# Patient Record
Sex: Female | Born: 1985 | Race: Black or African American | Hispanic: No | Marital: Married | Smoking: Never smoker
Health system: Southern US, Community
[De-identification: ages and names within clinical notes are randomized; demographics above are authoritative.]

## PROBLEM LIST (undated history)

## (undated) DIAGNOSIS — K2970 Gastritis, unspecified, without bleeding: Secondary | ICD-10-CM

## (undated) DIAGNOSIS — K8020 Calculus of gallbladder without cholecystitis without obstruction: Secondary | ICD-10-CM

## (undated) DIAGNOSIS — F419 Anxiety disorder, unspecified: Secondary | ICD-10-CM

## (undated) DIAGNOSIS — K219 Gastro-esophageal reflux disease without esophagitis: Secondary | ICD-10-CM

## (undated) HISTORY — DX: Anxiety disorder, unspecified: F41.9

## (undated) HISTORY — DX: Calculus of gallbladder without cholecystitis without obstruction: K80.20

---

## 2019-01-27 ENCOUNTER — Telehealth: Payer: Self-pay | Admitting: General Practice

## 2019-01-27 NOTE — Telephone Encounter (Signed)
Patients husband called and is trying to set up new pt appointment for wife, He said she has several concerns and would like for the first visit to be face to face, I let him know that with everything going on we are mainly doing virtual visits but he wanted me to ask to be sure. He didn't tell me what all the concerns were so im not sure if the nurse would like to give a call to verify best way to go about this.

## 2019-01-29 ENCOUNTER — Ambulatory Visit (INDEPENDENT_AMBULATORY_CARE_PROVIDER_SITE_OTHER): Payer: 59 | Admitting: Nurse Practitioner

## 2019-01-29 ENCOUNTER — Encounter: Payer: Self-pay | Admitting: Nurse Practitioner

## 2019-01-29 VITALS — Ht 64.96 in

## 2019-01-29 DIAGNOSIS — F411 Generalized anxiety disorder: Secondary | ICD-10-CM

## 2019-01-29 DIAGNOSIS — R0789 Other chest pain: Secondary | ICD-10-CM

## 2019-01-29 DIAGNOSIS — F322 Major depressive disorder, single episode, severe without psychotic features: Secondary | ICD-10-CM | POA: Diagnosis not present

## 2019-01-29 MED ORDER — IBUPROFEN 200 MG PO TABS: 200.0000 mg | ORAL_TABLET | Freq: Four times a day (QID) | ORAL | 0 refills | Status: DC | PRN

## 2019-01-29 NOTE — Telephone Encounter (Signed)
Heidi Keller is listed as pts PCP so first can we please clarify who pt wants to establish with.  [name], can you please call pt/husband to find about about pts concerns/issues. Depending on what they are, we can try to accommodate in-person visit

## 2019-01-29 NOTE — Telephone Encounter (Signed)
He called again yesterday and wanted to set up appointment with [facility], please disregard first message. Sorry about the confusion. I forgot about the note from day before and he didn't mention it

## 2019-01-29 NOTE — Patient Instructions (Signed)
Living With Anxiety    After being diagnosed with an anxiety disorder, you may be relieved to know why you have felt or behaved a certain way. It is natural to also feel overwhelmed about the treatment ahead and what it will mean for your life. With care and support, you can manage this condition and recover from it.  How to cope with anxiety  Dealing with stress  Stress is your body’s reaction to life changes and events, both good and bad. Stress can last just a few hours or it can be ongoing. Stress can play a major role in anxiety, so it is important to learn both how to cope with stress and how to think about it differently.  Talk with your health care provider or a counselor to learn more about stress reduction. He or she may suggest some stress reduction techniques, such as:  · Music therapy. This can include creating or listening to music that you enjoy and that inspires you.  · Mindfulness-based meditation. This involves being aware of your normal breaths, rather than trying to control your breathing. It can be done while sitting or walking.  · Centering prayer. This is a kind of meditation that involves focusing on a word, phrase, or sacred image that is meaningful to you and that brings you peace.  · Deep breathing. To do this, expand your stomach and inhale slowly through your nose. Hold your breath for 3-5 seconds. Then exhale slowly, allowing your stomach muscles to relax.  · Self-talk. This is a skill where you identify thought patterns that lead to anxiety reactions and correct those thoughts.  · Muscle relaxation. This involves tensing muscles then relaxing them.  Choose a stress reduction technique that fits your lifestyle and personality. Stress reduction techniques take time and practice. Set aside 5-15 minutes a day to do them. Therapists can offer training in these techniques. The training may be covered by some insurance plans. Other things you can do to manage stress include:  · Keeping a  stress diary. This can help you learn what triggers your stress and ways to control your response.  · Thinking about how you respond to certain situations. You may not be able to control everything, but you can control your reaction.  · Making time for activities that help you relax, and not feeling guilty about spending your time in this way.  Therapy combined with coping and stress-reduction skills provides the best chance for successful treatment.  Medicines  Medicines can help ease symptoms. Medicines for anxiety include:  · Anti-anxiety drugs.  · Antidepressants.  · Beta-blockers.  Medicines may be used as the main treatment for anxiety disorder, along with therapy, or if other treatments are not working. Medicines should be prescribed by a health care provider.  Relationships  Relationships can play a big part in helping you recover. Try to spend more time connecting with trusted friends and family members. Consider going to couples counseling, taking family education classes, or going to family therapy. Therapy can help you and others better understand the condition.  How to recognize changes in your condition  Everyone has a different response to treatment for anxiety. Recovery from anxiety happens when symptoms decrease and stop interfering with your daily activities at home or work. This may mean that you will start to:  · Have better concentration and focus.  · Sleep better.  · Be less irritable.  · Have more energy.  · Have improved memory.  It is   important to recognize when your condition is getting worse. Contact your health care provider if your symptoms interfere with home or work and you do not feel like your condition is improving.  Where to find help and support:  You can get help and support from these sources:  · Self-help groups.  · Online and community organizations.  · A trusted spiritual leader.  · Couples counseling.  · Family education classes.  · Family therapy.  Follow these instructions  at home:  · Eat a healthy diet that includes plenty of vegetables, fruits, whole grains, low-fat dairy products, and lean protein. Do not eat a lot of foods that are high in solid fats, added sugars, or salt.  · Exercise. Most adults should do the following:  ? Exercise for at least 150 minutes each week. The exercise should increase your heart rate and make you sweat (moderate-intensity exercise).  ? Strengthening exercises at least twice a week.  · Cut down on caffeine, tobacco, alcohol, and other potentially harmful substances.  · Get the right amount and quality of sleep. Most adults need 7-9 hours of sleep each night.  · Make choices that simplify your life.  · Take over-the-counter and prescription medicines only as told by your health care provider.  · Avoid caffeine, alcohol, and certain over-the-counter cold medicines. These may make you feel worse. Ask your pharmacist which medicines to avoid.  · Keep all follow-up visits as told by your health care provider. This is important.  Questions to ask your health care provider  · Would I benefit from therapy?  · How often should I follow up with a health care provider?  · How long do I need to take medicine?  · Are there any long-term side effects of my medicine?  · Are there any alternatives to taking medicine?  Contact a health care provider if:  · You have a hard time staying focused or finishing daily tasks.  · You spend many hours a day feeling worried about everyday life.  · You become exhausted by worry.  · You start to have headaches, feel tense, or have nausea.  · You urinate more than normal.  · You have diarrhea.  Get help right away if:  · You have a racing heart and shortness of breath.  · You have thoughts of hurting yourself or others.  If you ever feel like you may hurt yourself or others, or have thoughts about taking your own life, get help right away. You can go to your nearest emergency department or call:  · Your local emergency services  (911 in the [place]  · A suicide crisis helpline, such as the National Suicide Prevention Lifeline at 1-800-273-8255. This is open 24-hours a day.  Summary  · Taking steps to deal with stress can help calm you.  · Medicines cannot cure anxiety disorders, but they can help ease symptoms.  · Family, friends, and partners can play a big part in helping you recover from an anxiety disorder.  This information is not intended to replace advice given to you by your health care provider. Make sure you discuss any questions you have with your health care provider.  Document Released: 08/29/2016 Document Revised: 08/29/2016 Document Reviewed: 08/29/2016  Elsevier Interactive Patient Education © 2019 [employer] Inc[employer]

## 2019-01-29 NOTE — Progress Notes (Signed)
Virtual Visit via Video Note  I connected with Heidi Keller on 01/29/19 at  3:30 PM EDT by a video enabled telemedicine application and verified that I am speaking with the correct person using two identifiers.  Location: Patient: Home Provider: Office  Husband and daughter present during video call.  I discussed the limitations of evaluation and management by telemedicine and the availability of in person appointments. The patient expressed understanding and agreed to proceed.   CC: History of Present Illness: Chest Pain   This is a new problem. The current episode started more than 1 month ago. The onset quality is gradual. The problem occurs intermittently. The problem has been waxing and waning. The pain is present in the substernal region. The quality of the pain is described as dull and tightness. The pain does not radiate. Pertinent negatives include no abdominal pain, back pain, claudication, cough, diaphoresis, dizziness, exertional chest pressure, fever, headaches, hemoptysis, irregular heartbeat, leg pain, lower extremity edema, malaise/fatigue, nausea, near-syncope, numbness, orthopnea, palpitations, PND, shortness of breath, sputum production, syncope, vomiting or weakness. Risk factors include lack of exercise, sedentary lifestyle and stress.  Her past medical history is significant for anxiety/panic attacks.  Pertinent negatives for past medical history include no cancer, no diabetes, no DVT, no hypertension and no thyroid problem.  Her family medical history is significant for diabetes, hypertension and sickle cell disease.  Pertinent negatives for family medical history include: no CAD, no connective tissue disease, no heart disease, no early MI, no PE, no PVD, no stroke, no sudden death and no TIA.  Anxiety  Presents for initial visit. Onset was 1 to 5 years ago. The problem has been gradually worsening. Symptoms include chest pain, decreased concentration, depressed mood,  insomnia, malaise, muscle tension, nervous/anxious behavior, panic and restlessness. Patient reports no dizziness, nausea, palpitations or shortness of breath. Symptoms occur constantly. The severity of symptoms is causing significant distress and interfering with daily activities. The symptoms are aggravated by family issues (related to daughte's health: sickle cell diagnosis at age 74).   Risk factors include a major life event. Her past medical history is significant for anxiety/panic attacks. There is no history of anemia, bipolar disorder, depression, hyperthyroidism or suicide attempts. Past treatments include nothing.  married with 6135yrs old daughter, stay at home mother. reports she has not been sexually active in last 2years due to fear of having another child with Sickle cell anemia. Has not informed her family and friends about daughter's diagnosis.  Family History  Problem Relation Age of Onset  . Diabetes Maternal Grandmother   . Hypertension Maternal Grandmother   . Sickle cell anemia Daughter 1   Social History   Socioeconomic History  . Marital status: Married    Spouse name: Not on file  . Number of children: 1  . Years of education: Not on file  . Highest education level: Not on file  Occupational History  . Occupation: Housewife  Social Needs  . Financial resource strain: Not on file  . Food insecurity:    Worry: Not on file    Inability: Not on file  . Transportation needs:    Medical: Patient refused    Non-medical: Patient refused  Tobacco Use  . Smoking status: Never Smoker  . Smokeless tobacco: Never Used  Substance and Sexual Activity  . Alcohol use: Never    Frequency: Never  . Drug use: Never  . Sexual activity: Not Currently  Lifestyle  . Physical activity:    Days  per week: 0 days    Minutes per session: Not on file  . Stress: Very much  Relationships  . Social connections:    Talks on phone: Patient refused    Gets together: Patient refused     Attends religious service: Patient refused    Active member of club or organization: Patient refused    Attends meetings of clubs or organizations: Patient refused    Relationship status: Married  Other Topics Concern  . Not on file  Social History Narrative  . Not on file   No past medical history on file.   Observations/Objective: Unable to provide any vital signs. Physical Exam  Constitutional: She is oriented to person, place, and time. No distress.  Pulmonary/Chest: Effort normal.  Neurological: She is alert and oriented to person, place, and time.  Psychiatric: Her speech is normal and behavior is normal. Thought content normal. Her affect is blunt.   Depression screen PHQ 2/9 01/29/2019  Decreased Interest 0  Down, Depressed, Hopeless 2  PHQ - 2 Score 2  Altered sleeping 2  Tired, decreased energy 1  Change in appetite 3  Feeling bad or failure about yourself  2  Trouble concentrating 2  Moving slowly or fidgety/restless 2  Suicidal thoughts 0  PHQ-9 Score 14  Difficult doing work/chores Extremely dIfficult   GAD 7 : Generalized Anxiety Score 01/29/2019  Nervous, Anxious, on Edge 3  Control/stop worrying 3  Worry too much - different things 3  Trouble relaxing 2  Restless 3  Easily annoyed or irritable 2  Afraid - awful might happen 3  Total GAD 7 Score 19  Anxiety Difficulty Extremely difficult    Assessment and Plan: Heidi Keller for establish care.  Diagnoses and all orders for this visit:  Anterior chest wall pain -     ibuprofen (ADVIL) 200 MG tablet; Take 1-2 tablets (200-400 mg total) by mouth every 6 (six) hours as needed.  GAD (generalized anxiety disorder) -     Ambulatory referral to Psychology  Depression, major, single episode, severe (HCC) -     Ambulatory referral to Psychology   Follow Up Instructions: Use ibuprofen for chest wall pain. F/up in office on Friday: need blood draw, physical exam and to discuss possible use  of SSRI? Entered referral to psychology. Advised to search for local support group related to sickle cell anemia   I discussed the assessment and treatment plan with the patient. The patient was provided an opportunity to ask questions and all were answered. The patient agreed with the plan and demonstrated an understanding of the instructions.   The patient was advised to call back or seek an in-person evaluation if the symptoms worsen or if the condition fails to improve as anticipated.   Alysia Penna, NP

## 2019-01-31 ENCOUNTER — Ambulatory Visit (INDEPENDENT_AMBULATORY_CARE_PROVIDER_SITE_OTHER): Payer: 59 | Admitting: Nurse Practitioner

## 2019-01-31 ENCOUNTER — Encounter: Payer: Self-pay | Admitting: Nurse Practitioner

## 2019-01-31 VITALS — BP 130/90 | HR 81 | Temp 98.6°F | Ht 64.96 in | Wt 155.8 lb

## 2019-01-31 DIAGNOSIS — K219 Gastro-esophageal reflux disease without esophagitis: Secondary | ICD-10-CM | POA: Diagnosis not present

## 2019-01-31 DIAGNOSIS — F411 Generalized anxiety disorder: Secondary | ICD-10-CM

## 2019-01-31 DIAGNOSIS — R0789 Other chest pain: Secondary | ICD-10-CM

## 2019-01-31 DIAGNOSIS — F322 Major depressive disorder, single episode, severe without psychotic features: Secondary | ICD-10-CM | POA: Diagnosis not present

## 2019-01-31 MED ORDER — OMEPRAZOLE 20 MG PO CPDR: 20.0000 mg | DELAYED_RELEASE_CAPSULE | Freq: Every day | ORAL | 0 refills | Status: DC

## 2019-01-31 NOTE — Patient Instructions (Addendum)
Go to lab for blood draw. You will be contacted to schedule appt with psychiatry and psychology. Continue use of tylenol or ibuprofen as needed for pain F/up in 20month.   Living With Anxiety  After being diagnosed with an anxiety disorder, you may be relieved to know why you have felt or behaved a certain way. It is natural to also feel overwhelmed about the treatment ahead and what it will mean for your life. With care and support, you can manage this condition and recover from it. How to cope with anxiety Dealing with stress Stress is your body's reaction to life changes and events, both good and bad. Stress can last just a few hours or it can be ongoing. Stress can play a major role in anxiety, so it is important to learn both how to cope with stress and how to think about it differently. Talk with your health care provider or a counselor to learn more about stress reduction. He or she may suggest some stress reduction techniques, such as:  Music therapy. This can include creating or listening to music that you enjoy and that inspires you.  Mindfulness-based meditation. This involves being aware of your normal breaths, rather than trying to control your breathing. It can be done while sitting or walking.  Centering prayer. This is a kind of meditation that involves focusing on a word, phrase, or sacred image that is meaningful to you and that brings you peace.  Deep breathing. To do this, expand your stomach and inhale slowly through your nose. Hold your breath for 3-5 seconds. Then exhale slowly, allowing your stomach muscles to relax.  Self-talk. This is a skill where you identify thought patterns that lead to anxiety reactions and correct those thoughts.  Muscle relaxation. This involves tensing muscles then relaxing them. Choose a stress reduction technique that fits your lifestyle and personality. Stress reduction techniques take time and practice. Set aside 5-15 minutes a day to do  them. Therapists can offer training in these techniques. The training may be covered by some insurance plans. Other things you can do to manage stress include:  Keeping a stress diary. This can help you learn what triggers your stress and ways to control your response.  Thinking about how you respond to certain situations. You may not be able to control everything, but you can control your reaction.  Making time for activities that help you relax, and not feeling guilty about spending your time in this way. Therapy combined with coping and stress-reduction skills provides the best chance for successful treatment. Medicines Medicines can help ease symptoms. Medicines for anxiety include:  Anti-anxiety drugs.  Antidepressants.  Beta-blockers. Medicines may be used as the main treatment for anxiety disorder, along with therapy, or if other treatments are not working. Medicines should be prescribed by a health care provider. Relationships Relationships can play a big part in helping you recover. Try to spend more time connecting with trusted friends and family members. Consider going to couples counseling, taking family education classes, or going to family therapy. Therapy can help you and others better understand the condition. How to recognize changes in your condition Everyone has a different response to treatment for anxiety. Recovery from anxiety happens when symptoms decrease and stop interfering with your daily activities at home or work. This may mean that you will start to:  Have better concentration and focus.  Sleep better.  Be less irritable.  Have more energy.  Have improved memory. It is important  to recognize when your condition is getting worse. Contact your health care provider if your symptoms interfere with home or work and you do not feel like your condition is improving. Where to find help and support: You can get help and support from these sources:  Self-help  groups.  Online and [employer] organizations.  A trusted spiritual leader.  Couples counseling.  Family education classes.  Family therapy. Follow these instructions at home:  Eat a healthy diet that includes plenty of vegetables, fruits, whole grains, low-fat dairy products, and lean protein. Do not eat a lot of foods that are high in solid fats, added sugars, or salt.  Exercise. Most adults should do the following: ? Exercise for at least 150 minutes each week. The exercise should increase your heart rate and make you sweat (moderate-intensity exercise). ? Strengthening exercises at least twice a week.  Cut down on caffeine, tobacco, alcohol, and other potentially harmful substances.  Get the right amount and quality of sleep. Most adults need 7-9 hours of sleep each night.  Make choices that simplify your life.  Take over-the-counter and prescription medicines only as told by your health care provider.  Avoid caffeine, alcohol, and certain over-the-counter cold medicines. These may make you feel worse. Ask your pharmacist which medicines to avoid.  Keep all follow-up visits as told by your health care provider. This is important. Questions to ask your health care provider  Would I benefit from therapy?  How often should I follow up with a health care provider?  How long do I need to take medicine?  Are there any long-term side effects of my medicine?  Are there any alternatives to taking medicine? Contact a health care provider if:  You have a hard time staying focused or finishing daily tasks.  You spend many hours a day feeling worried about everyday life.  You become exhausted by worry.  You start to have headaches, feel tense, or have nausea.  You urinate more than normal.  You have diarrhea. Get help right away if:  You have a racing heart and shortness of breath.  You have thoughts of hurting yourself or others. If you ever feel like you may hurt  yourself or others, or have thoughts about taking your own life, get help right away. You can go to your nearest emergency department or call:  Your local emergency services (911 in the [place]  A suicide crisis helpline, such as the National Suicide Prevention Lifeline at 313-768-14151-351-007-8144. This is open 24-hours a day. Summary  Taking steps to deal with stress can help calm you.  Medicines cannot cure anxiety disorders, but they can help ease symptoms.  Family, friends, and partners can play a big part in helping you recover from an anxiety disorder. This information is not intended to replace advice given to you by your health care provider. Make sure you discuss any questions you have with your health care provider. Document Released: 08/29/2016 Document Revised: 08/29/2016 Document Reviewed: 08/29/2016 Elsevier Interactive Patient Education  2019 [employer]

## 2019-01-31 NOTE — Progress Notes (Signed)
Subjective:  Patient ID: Heidi Keller, female    DOB: 11/18/1985  Age: 33 y.o. 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 still has pain around chest area and on the side,lightheadache/ pt mention she feels "swelling under arm and right side neck. fasting)  HPI Chest wall pain: Resolved with use of ibuprofen. Chest wall pain not related to food intake Reports intermittent heartburn with certain food.  Anxiety and depression: Related to daughter's health and sickle cell diagnosis. Denies any physical or emotional or verbal abuse. She is not interested in use of medication at this time. She wants to try counseling at this time.  Reviewed past Medical, Social and Family history today.  Outpatient Medications Prior to Visit  Medication Sig Dispense Refill  . ibuprofen (ADVIL) 200 MG tablet Take 1-2 tablets (200-400 mg total) by mouth every 6 (six) hours as needed. 30 tablet 0  . Multiple Vitamin (MULTIVITAMIN) tablet Take 1 tablet by mouth daily.     No facility-administered medications prior to visit.     ROS Review of Systems  Constitutional: Positive for malaise/fatigue. Negative for chills, fever and weight loss.  Respiratory: Negative.   Cardiovascular: Negative.   Gastrointestinal: Positive for heartburn. Negative for constipation and diarrhea.  Genitourinary: Negative.   Musculoskeletal: Positive for joint pain, myalgias and neck pain.  Skin: Negative.   Psychiatric/Behavioral: Negative.     Objective:  BP 130/90   Pulse 81   Temp 98.6 F (37 C) (Oral)   Ht 5' 4.96" (1.65 m)   Wt 155 lb 12.8 oz (70.7 kg)   LMP 01/26/2019 (Exact Date)   SpO2 96%   BMI 25.96 kg/m   BP Readings from Last 3 Encounters:  01/31/19 130/90    Wt Readings from Last 3 Encounters:  01/31/19 155 lb 12.8 oz (70.7 kg)   Physical Exam Vitals signs reviewed.  Constitutional:      General: She is not in acute distress. HENT:   Right Ear: External ear normal.     Left Ear: External ear normal.     Nose: Nose normal.     Mouth/Throat:     Mouth: Mucous membranes are moist.     Pharynx: No posterior oropharyngeal erythema.  Eyes:     Extraocular Movements: Extraocular movements intact.     Conjunctiva/sclera: Conjunctivae normal.  Neck:     Musculoskeletal: Normal range of motion and neck supple.  Cardiovascular:     Rate and Rhythm: Normal rate and regular rhythm.     Pulses: Normal pulses.     Heart sounds: Normal heart sounds.  Pulmonary:     Effort: Pulmonary effort is normal.     Breath sounds: Normal breath sounds.  Chest:     Chest wall: No mass or swelling.  Abdominal:     General: Bowel sounds are normal.     Palpations: Abdomen is soft.  Musculoskeletal: Normal range of motion.  Lymphadenopathy:     Cervical: No cervical adenopathy.     Upper Body:     Right upper body: No supraclavicular, axillary or pectoral adenopathy.     Left upper body: No supraclavicular, axillary or pectoral adenopathy.  Skin:    Findings: No erythema or rash.  Neurological:     Mental Status: She is alert and oriented to person, place, and time.  Psychiatric:        Attention and Perception: Attention normal.        Mood  and Affect: Mood is depressed. Affect is flat.        Behavior: Behavior is slowed. Behavior is cooperative.        Thought Content: Thought content does not include homicidal or suicidal ideation. Thought content does not include homicidal or suicidal plan.        Cognition and Memory: Cognition normal.     Comments: Does not maintain eye contact    Lab Results  Component Value Date   WBC 2.7 (L) 01/31/2019   HGB 12.5 01/31/2019   HCT 38.1 01/31/2019   PLT 197 01/31/2019   GLUCOSE 88 01/31/2019   ALT 9 01/31/2019   AST 11 01/31/2019   NA 139 01/31/2019   K 4.3 01/31/2019   CL 107 01/31/2019   CREATININE 0.74 01/31/2019   BUN 9 01/31/2019   CO2 26 01/31/2019   TSH 2.55 01/31/2019     Assessment & Plan:   Andrian was seen today for follow-up.  Diagnoses and all orders for this visit:  Right-sided chest wall pain -     Vitamin D 1,25 dihydroxy  GAD (generalized anxiety disorder) -     Comprehensive metabolic panel -     CBC with Differential/Platelet -     TSH -     Vitamin D 1,25 dihydroxy  Gastroesophageal reflux disease without esophagitis -     omeprazole (PRILOSEC) 20 MG capsule; Take 1 capsule (20 mg total) by mouth daily.  Depression, major, single episode, severe (HCC) -     Vitamin D 1,25 dihydroxy   I am having Jenifer Varricchio start on omeprazole. I am also having her maintain her multivitamin and ibuprofen.  Meds ordered this encounter  Medications  . omeprazole (PRILOSEC) 20 MG capsule    Sig: Take 1 capsule (20 mg total) by mouth daily.    Dispense:  30 capsule    Refill:  0    Order Specific Question:   Supervising Provider    Answer:   MATTHEWS, CODY [[id number]]    Problem List Items Addressed This Visit      Digestive   Gastroesophageal reflux disease without esophagitis    Start omeprazole      Relevant Medications   omeprazole (PRILOSEC) 20 MG capsule     Other   Depression, major, single episode, severe (HCC)    Declined use of medication at this time. Entered referral for counseling F/up in 3month      Relevant Orders   Vitamin D 1,25 dihydroxy   GAD (generalized anxiety disorder)    Declined use of medication at this time. Entered referral for counseling F/up in 54month      Relevant Orders   Comprehensive metabolic panel (Completed)   CBC with Differential/Platelet (Completed)   TSH (Completed)   Vitamin D 1,25 dihydroxy    Other Visit Diagnoses    Right-sided chest wall pain    -  Primary   Relevant Orders   Vitamin D 1,25 dihydroxy      Follow-up: Return in about 4 weeks (around 02/28/2019) for video call (anxiety and depression).  Heidi Penna, NP

## 2019-02-03 ENCOUNTER — Encounter: Payer: Self-pay | Admitting: Nurse Practitioner

## 2019-02-03 DIAGNOSIS — F322 Major depressive disorder, single episode, severe without psychotic features: Secondary | ICD-10-CM | POA: Insufficient documentation

## 2019-02-03 DIAGNOSIS — K219 Gastro-esophageal reflux disease without esophagitis: Secondary | ICD-10-CM | POA: Insufficient documentation

## 2019-02-03 DIAGNOSIS — F411 Generalized anxiety disorder: Secondary | ICD-10-CM | POA: Insufficient documentation

## 2019-02-03 NOTE — Assessment & Plan Note (Signed)
Declined use of medication at this time. Entered referral for counseling F/up in 68month

## 2019-02-03 NOTE — Assessment & Plan Note (Signed)
Declined use of medication at this time. Entered referral for counseling F/up in 1month 

## 2019-02-03 NOTE — Assessment & Plan Note (Signed)
Start omeprazole

## 2019-02-04 LAB — COMPREHENSIVE METABOLIC PANEL
AG Ratio: 1.6 (calc) (ref 1.0–2.5)
ALT: 9 U/L (ref 6–29)
AST: 11 U/L (ref 10–30)
Albumin: 4.6 g/dL (ref 3.6–5.1)
Alkaline phosphatase (APISO): 36 U/L (ref 31–125)
BUN: 9 mg/dL (ref 7–25)
CO2: 26 mmol/L (ref 20–32)
Calcium: 9.7 mg/dL (ref 8.6–10.2)
Chloride: 107 mmol/L (ref 98–110)
Creat: 0.74 mg/dL (ref 0.50–1.10)
Globulin: 2.8 g/dL (calc) (ref 1.9–3.7)
Glucose, Bld: 88 mg/dL (ref 65–99)
Potassium: 4.3 mmol/L (ref 3.5–5.3)
Sodium: 139 mmol/L (ref 135–146)
Total Bilirubin: 1.5 mg/dL — ABNORMAL HIGH (ref 0.2–1.2)
Total Protein: 7.4 g/dL (ref 6.1–8.1)

## 2019-02-04 LAB — CBC WITH DIFFERENTIAL/PLATELET
Absolute Monocytes: 162 cells/uL — ABNORMAL LOW (ref 200–950)
Basophils Absolute: 22 cells/uL (ref 0–200)
Basophils Relative: 0.8 %
Eosinophils Absolute: 11 cells/uL — ABNORMAL LOW (ref 15–500)
Eosinophils Relative: 0.4 %
HCT: 38.1 % (ref 35.0–45.0)
Hemoglobin: 12.5 g/dL (ref 11.7–15.5)
Lymphs Abs: 1247 cells/uL (ref 850–3900)
MCH: 30.7 pg (ref 27.0–33.0)
MCHC: 32.8 g/dL (ref 32.0–36.0)
MCV: 93.6 fL (ref 80.0–100.0)
MPV: 12.4 fL (ref 7.5–12.5)
Monocytes Relative: 6 %
Neutro Abs: 1258 cells/uL — ABNORMAL LOW (ref 1500–7800)
Neutrophils Relative %: 46.6 %
Platelets: 197 10*3/uL (ref 140–400)
RBC: 4.07 10*6/uL (ref 3.80–5.10)
RDW: 11.9 % (ref 11.0–15.0)
Total Lymphocyte: 46.2 %
WBC: 2.7 10*3/uL — ABNORMAL LOW (ref 3.8–10.8)

## 2019-02-04 LAB — VITAMIN D 1,25 DIHYDROXY
Vitamin D 1, 25 (OH)2 Total: 77 pg/mL — ABNORMAL HIGH (ref 18–72)
Vitamin D2 1, 25 (OH)2: <8 pg/mL
Vitamin D3 1, 25 (OH)2: 77 pg/mL

## 2019-02-04 LAB — TSH: TSH: 2.55 mIU/L

## 2019-03-03 ENCOUNTER — Encounter: Payer: Self-pay | Admitting: Nurse Practitioner

## 2019-03-03 ENCOUNTER — Other Ambulatory Visit: Payer: Self-pay

## 2019-03-03 ENCOUNTER — Ambulatory Visit (INDEPENDENT_AMBULATORY_CARE_PROVIDER_SITE_OTHER): Payer: 59 | Admitting: Nurse Practitioner

## 2019-03-03 ENCOUNTER — Ambulatory Visit (INDEPENDENT_AMBULATORY_CARE_PROVIDER_SITE_OTHER): Payer: 59

## 2019-03-03 VITALS — Ht 64.96 in

## 2019-03-03 DIAGNOSIS — R0789 Other chest pain: Secondary | ICD-10-CM

## 2019-03-03 DIAGNOSIS — F322 Major depressive disorder, single episode, severe without psychotic features: Secondary | ICD-10-CM | POA: Diagnosis not present

## 2019-03-03 DIAGNOSIS — D72829 Elevated white blood cell count, unspecified: Secondary | ICD-10-CM | POA: Diagnosis not present

## 2019-03-03 LAB — CBC WITH DIFFERENTIAL/PLATELET
Absolute Monocytes: 308 cells/uL (ref 200–950)
Basophils Absolute: 31 cells/uL (ref 0–200)
Basophils Relative: 0.7 %
Eosinophils Absolute: 31 cells/uL (ref 15–500)
Eosinophils Relative: 0.7 %
HCT: 38.3 % (ref 35.0–45.0)
Hemoglobin: 12.7 g/dL (ref 11.7–15.5)
Lymphs Abs: 1905 cells/uL (ref 850–3900)
MCH: 30.8 pg (ref 27.0–33.0)
MCHC: 33.2 g/dL (ref 32.0–36.0)
MCV: 92.7 fL (ref 80.0–100.0)
MPV: 12.3 fL (ref 7.5–12.5)
Monocytes Relative: 7 %
Neutro Abs: 2125 cells/uL (ref 1500–7800)
Neutrophils Relative %: 48.3 %
Platelets: 206 10*3/uL (ref 140–400)
RBC: 4.13 10*6/uL (ref 3.80–5.10)
RDW: 12.1 % (ref 11.0–15.0)
Total Lymphocyte: 43.3 %
WBC: 4.4 10*3/uL (ref 3.8–10.8)

## 2019-03-03 LAB — POCT URINE PREGNANCY: Preg Test, Ur: NEGATIVE

## 2019-03-03 NOTE — Patient Instructions (Signed)
Go to lab for repeat cbc, urine pregnancy and CXR. F/up prn

## 2019-03-03 NOTE — Progress Notes (Signed)
Virtual Visit via Video Note  I connected with Heidi Keller on 03/03/19 at  1:00 PM EDT by a video enabled telemedicine application and verified that I am speaking with the correct person using two identifiers.  Location: Patient: Home Provider: Office   I discussed the limitations of evaluation and management by telemedicine and the availability of in person appointments. The patient expressed understanding and agreed to proceed.  CC: Depression f/up, chest wall pain.  History of Present Illness: Heidi Keller states she no longer needs  appt with psychologist. She has reached out to local sickle cell support group and obtained some useful information. She reports concern about intermittent right chest wall pain, waxing and waning, resolves with use of ibuprofen, denies any new symptoms. Rates discomfort 3-10. She is requesting for CXR.   Review of Systems  Constitutional: Negative.   Respiratory: Negative.   Cardiovascular: Negative.   Gastrointestinal: Negative.   Genitourinary: Negative.   Skin: Negative.   Neurological: Negative for dizziness, tingling and sensory change.  Psychiatric/Behavioral: Negative for substance abuse and suicidal ideas. The patient is nervous/anxious. The patient does not have insomnia.    Observations/Objective: Physical Exam  Constitutional: She is oriented to person, place, and time. No distress.  Neurological: She is alert and oriented to person, place, and time.  Psychiatric: She has a normal mood and affect. Her behavior is normal. Thought content normal.   Assessment and Plan: Heidi Keller was seen today for follow-up.  Diagnoses and all orders for this visit:  Depression, major, single episode, severe (HCC)  Leukocytosis, unspecified type -     CBC w/Diff  Right-sided chest wall pain -     DG Chest 2 View; Future -     POCT urine pregnancy   Follow Up Instructions: Go to lab for repeat cbc, urine pregnancy and CXR. F/up prn   I  discussed the assessment and treatment plan with the patient. The patient was provided an opportunity to ask questions and all were answered. The patient agreed with the plan and demonstrated an understanding of the instructions.   The patient was advised to call back or seek an in-person evaluation if the symptoms worsen or if the condition fails to improve as anticipated.   Wilfred Lacy, NP

## 2019-04-15 ENCOUNTER — Encounter: Payer: Self-pay | Admitting: Nurse Practitioner

## 2019-04-15 DIAGNOSIS — R1011 Right upper quadrant pain: Secondary | ICD-10-CM

## 2019-04-15 DIAGNOSIS — K8020 Calculus of gallbladder without cholecystitis without obstruction: Secondary | ICD-10-CM

## 2019-04-17 ENCOUNTER — Encounter: Payer: Self-pay | Admitting: Nurse Practitioner

## 2019-04-17 ENCOUNTER — Telehealth (INDEPENDENT_AMBULATORY_CARE_PROVIDER_SITE_OTHER): Payer: 59 | Admitting: Nurse Practitioner

## 2019-04-17 ENCOUNTER — Telehealth: Payer: 59 | Admitting: Family

## 2019-04-17 ENCOUNTER — Other Ambulatory Visit: Payer: Self-pay

## 2019-04-17 DIAGNOSIS — R1013 Epigastric pain: Secondary | ICD-10-CM | POA: Diagnosis not present

## 2019-04-17 DIAGNOSIS — I889 Nonspecific lymphadenitis, unspecified: Secondary | ICD-10-CM

## 2019-04-17 DIAGNOSIS — K0889 Other specified disorders of teeth and supporting structures: Secondary | ICD-10-CM

## 2019-04-17 MED ORDER — AMOXICILLIN-POT CLAVULANATE 875-125 MG PO TABS: 1.0000 | ORAL_TABLET | Freq: Two times a day (BID) | ORAL | 0 refills | Status: AC

## 2019-04-17 NOTE — Patient Instructions (Addendum)
You will be contacted to schedule appt for ABD [place].  Schedule appt with dentist within 2weeks to oral abx completion.    Dental Pain Dental pain may be caused by many things, including:  Tooth decay (cavities or caries).  Infection.  The inner part of the tooth being filled with pus (abscess).  Injury. Sometimes the cause of pain is unknown. Your pain can vary. It may be mild or severe. You may have it all the time, or it may occur only when you are:  Chewing.  Exposed to hot or cold temperature.  Eating or drinking sugary foods or beverages, such as soda or candy. Follow these instructions at home: Medicines  Take over-the-counter and prescription medicines only as told by your doctor.  If you were prescribed an antibiotic medicine, take it as told by your doctor. Do not stop taking the medicine even if you start to feel better. Eating and drinking  Do not eat foods or drinks that cause you pain. These include: ? Very hot or very cold foods or drinks. ? Sweet or sugary foods or drinks. Managing pain and swelling   Gargle with a salt-water mixture 3-4 times a day. To make this, dissolve -1 tsp of salt in 1 cup of warm water.  If told, put ice on the painful area of your face: ? Put ice in a plastic bag. ? Place a towel between your skin and the bag. ? Leave the ice on for 20 minutes, 2-3 times a day. Brushing your teeth  Brush your teeth twice a day using a fluoride toothpaste.  Floss your teeth once a day.  Use a toothpaste made for sensitive teeth as told by your doctor.  Use a soft toothbrush. General instructions  Do not apply heat to the outside of your face.  Watch your dental pain. Let your doctor know if there are any changes.  Keep all follow-up visits as told by your doctor. This is important. Contact a doctor if:  Your pain is not relieved by medicines.  You have new symptoms.  Your symptoms get worse. Get help right away if:  You cannot  open your mouth.  You are having trouble breathing or swallowing.  You have a fever.  Your face, neck, or jaw is swollen. Summary  Dental pain may be caused by many things, including tooth decay, injury, or infection. In some cases, the cause is not known.  Your pain may be mild or severe. You may have pain all the time, or you may have it only when you eat or drink.  Take over-the-counter and prescription medicines only as told by your doctor.  Watch your dental pain for any changes. Let your doctor know if symptoms get worse. This information is not intended to replace advice given to you by your health care provider. Make sure you discuss any questions you have with your health care provider. Document Released: 02/21/2008 Document Revised: 12/31/2018 Document Reviewed: 09/17/2017 Elsevier Patient Education  2020 [employer].

## 2019-04-17 NOTE — Progress Notes (Signed)
Based on what you shared with me, I feel your condition warrants further evaluation and I recommend that you be seen for a face to face office visit.  NOTE: If you entered your credit card information for this eVisit, you will not be charged. You may see a "hold" on your card for the $35 but that hold will drop off and you will not have a charge processed.  If you are having a true medical emergency please call 911.     For an urgent face to face visit, [facility] has five urgent care centers for your convenience:    [email or web address] to reserve your spot online an avoid wait times  [facility] (New Address!) [address], [address] [place], [place] [address] *Just off [address], across the road from [employer]* Modified hours of operation: Monday-Friday, 12 PM to 6 PM  Closed Saturday & Sunday   The following sites will take your insurance:  . [facility]    [phone]                  Get Driving Directions  [address] , [place] [address] . 10 am to 8 pm Monday-Friday . 12 pm to 8 pm Saturday-Sunday   . [facility]  [phone]                  Get Driving Directions  [address], [address] [place], [place] [address] . 8 am to 8 pm Monday-Friday . 9 am to 6 pm Saturday . 11 am to 6 pm Sunday   . [facility] [place]  [phone]                  Get Driving Directions   [address] [place], [place] [address] . 8 am to 8 pm Monday-Friday . 8 am to 4 pm Saturday-Sunday    .  [facility]                    Get Driving Directions  [phone]  [address] [place], [place] [address]  . Monday-Friday, 12 PM to 6 PM    Your e-visit answers were reviewed by a board certified advanced clinical practitioner to complete your personal care plan.  Thank you for using e-Visits. 

## 2019-04-17 NOTE — Progress Notes (Signed)
Virtual Visit via Video Note  I connected with Mike Silbaugh on 04/17/19 at  4:00 PM EDT by a video enabled telemedicine application and verified that I am speaking with the correct person using two identifiers.  Location: Patient: Home Provider: Office   I discussed the limitations of evaluation and management by telemedicine and the availability of in person appointments. The patient expressed understanding and agreed to proceed.  CC: intermittent right side pain, tooth pain and swollen lymph node.  History of Present Illness: Dental Pain  This is a new problem. The current episode started 1 to 4 weeks ago. The problem occurs constantly. The problem has been unchanged. Associated symptoms include sinus pressure and thermal sensitivity. Pertinent negatives include no difficulty swallowing, facial pain, fever or oral bleeding. She has tried acetaminophen and NSAIDs for the symptoms. The treatment provided mild relief.   She also reports persistent intermittent right chest wall and epigastric pain despite use of omeprazole, tylenol, and ibuprofen. Denies any SOB or fever or PND or rash or injury.   Observations/Objective: Unable to obtain any vital signs Physical Exam  Constitutional: No distress.  HENT:  Head: Normocephalic.  Right Ear: External ear normal.  Left Ear: External ear normal.  Nose: Nose normal.  Mouth/Throat: Oropharynx is clear and moist. No oral lesions. No trismus in the jaw. No tonsillar abscesses.    Neck: Normal range of motion. Neck supple. No edema and no erythema present. No thyroid mass present.    Pulmonary/Chest: Effort normal.   Assessment and Plan: Gracelin was seen today for lump on chin.  Diagnoses and all orders for this visit:  Epigastric pain -     US Abdomen Limited RUQ; Future  Tooth pain -     amoxicillin-clavulanate (AUGMENTIN) 875-125 MG tablet; Take 1 tablet by mouth 2 (two) times daily for 7 days. With food   Follow Up  Instructions: See discharge summary.   I discussed the assessment and treatment plan with the patient. The patient was provided an opportunity to ask questions and all were answered. The patient agreed with the plan and demonstrated an understanding of the instructions.   The patient was advised to call back or seek an in-person evaluation if the symptoms worsen or if the condition fails to improve as anticipated.   Wilfred Lacy, NP

## 2019-04-21 ENCOUNTER — Ambulatory Visit
Admission: RE | Admit: 2019-04-21 | Discharge: 2019-04-21 | Disposition: A | Discharge status: Home / Self Care | Payer: 59 | Source: Ambulatory Visit | Attending: Nurse Practitioner | Admitting: Nurse Practitioner

## 2019-04-21 DIAGNOSIS — R1013 Epigastric pain: Secondary | ICD-10-CM

## 2019-04-21 NOTE — Telephone Encounter (Signed)
-----   Message from Janan Ridge, LPN sent at 02/20/3328 10:58 AM EDT ----- Pt verbalized understanding of [place] report. Pt agrees to be referred to general surgeon

## 2019-04-24 ENCOUNTER — Telehealth: Payer: Self-pay | Admitting: Nurse Practitioner

## 2019-04-24 ENCOUNTER — Encounter: Payer: 59 | Admitting: Nurse Practitioner

## 2019-04-24 NOTE — Telephone Encounter (Signed)

## 2019-04-25 ENCOUNTER — Ambulatory Visit (INDEPENDENT_AMBULATORY_CARE_PROVIDER_SITE_OTHER): Payer: 59 | Admitting: Nurse Practitioner

## 2019-04-25 ENCOUNTER — Encounter: Payer: Self-pay | Admitting: Nurse Practitioner

## 2019-04-25 ENCOUNTER — Other Ambulatory Visit (HOSPITAL_COMMUNITY)
Admission: RE | Admit: 2019-04-25 | Discharge: 2019-04-25 | Disposition: A | Discharge status: Home / Self Care | Payer: 59 | Source: Ambulatory Visit | Attending: Nurse Practitioner | Admitting: Nurse Practitioner

## 2019-04-25 ENCOUNTER — Other Ambulatory Visit: Payer: Self-pay

## 2019-04-25 VITALS — BP 124/82 | HR 79 | Temp 98.2°F | Ht 64.96 in | Wt 160.2 lb

## 2019-04-25 DIAGNOSIS — Z13220 Encounter for screening for lipoid disorders: Secondary | ICD-10-CM

## 2019-04-25 DIAGNOSIS — Z114 Encounter for screening for human immunodeficiency virus [HIV]: Secondary | ICD-10-CM

## 2019-04-25 DIAGNOSIS — Z23 Encounter for immunization: Secondary | ICD-10-CM

## 2019-04-25 DIAGNOSIS — Z124 Encounter for screening for malignant neoplasm of cervix: Secondary | ICD-10-CM | POA: Diagnosis present

## 2019-04-25 DIAGNOSIS — Z0000 Encounter for general adult medical examination without abnormal findings: Secondary | ICD-10-CM | POA: Diagnosis present

## 2019-04-25 DIAGNOSIS — Z136 Encounter for screening for cardiovascular disorders: Secondary | ICD-10-CM

## 2019-04-25 DIAGNOSIS — Z113 Encounter for screening for infections with a predominantly sexual mode of transmission: Secondary | ICD-10-CM

## 2019-04-25 NOTE — Patient Instructions (Signed)
Go to lab for blood draw.   Living With Anxiety  After being diagnosed with an anxiety disorder, you may be relieved to know why you have felt or behaved a certain way. It is natural to also feel overwhelmed about the treatment ahead and what it will mean for your life. With care and support, you can manage this condition and recover from it. How to cope with anxiety Dealing with stress Stress is your body's reaction to life changes and events, both good and bad. Stress can last just a few hours or it can be ongoing. Stress can play a major role in anxiety, so it is important to learn both how to cope with stress and how to think about it differently. Talk with your health care provider or a counselor to learn more about stress reduction. He or she may suggest some stress reduction techniques, such as:  Music therapy. This can include creating or listening to music that you enjoy and that inspires you.  Mindfulness-based meditation. This involves being aware of your normal breaths, rather than trying to control your breathing. It can be done while sitting or walking.  Centering prayer. This is a kind of meditation that involves focusing on a word, phrase, or sacred image that is meaningful to you and that brings you peace.  Deep breathing. To do this, expand your stomach and inhale slowly through your nose. Hold your breath for 3-5 seconds. Then exhale slowly, allowing your stomach muscles to relax.  Self-talk. This is a skill where you identify thought patterns that lead to anxiety reactions and correct those thoughts.  Muscle relaxation. This involves tensing muscles then relaxing them. Choose a stress reduction technique that fits your lifestyle and personality. Stress reduction techniques take time and practice. Set aside 5-15 minutes a day to do them. Therapists can offer training in these techniques. The training may be covered by some insurance plans. Other things you can do to manage  stress include:  Keeping a stress diary. This can help you learn what triggers your stress and ways to control your response.  Thinking about how you respond to certain situations. You may not be able to control everything, but you can control your reaction.  Making time for activities that help you relax, and not feeling guilty about spending your time in this way. Therapy combined with coping and stress-reduction skills provides the best chance for successful treatment. Medicines Medicines can help ease symptoms. Medicines for anxiety include:  Anti-anxiety drugs.  Antidepressants.  Beta-blockers. Medicines may be used as the main treatment for anxiety disorder, along with therapy, or if other treatments are not working. Medicines should be prescribed by a health care provider. Relationships Relationships can play a big part in helping you recover. Try to spend more time connecting with trusted friends and family members. Consider going to couples counseling, taking family education classes, or going to family therapy. Therapy can help you and others better understand the condition. How to recognize changes in your condition Everyone has a different response to treatment for anxiety. Recovery from anxiety happens when symptoms decrease and stop interfering with your daily activities at home or work. This may mean that you will start to:  Have better concentration and focus.  Sleep better.  Be less irritable.  Have more energy.  Have improved memory. It is important to recognize when your condition is getting worse. Contact your health care provider if your symptoms interfere with home or work and you do  not feel like your condition is improving. Where to find help and support: You can get help and support from these sources:  Self-help groups.  Online and [employer] organizations.  A trusted spiritual leader.  Couples counseling.  Family education classes.  Family  therapy. Follow these instructions at home:  Eat a healthy diet that includes plenty of vegetables, fruits, whole grains, low-fat dairy products, and lean protein. Do not eat a lot of foods that are high in solid fats, added sugars, or salt.  Exercise. Most adults should do the following: ? Exercise for at least 150 minutes each week. The exercise should increase your heart rate and make you sweat (moderate-intensity exercise). ? Strengthening exercises at least twice a week.  Cut down on caffeine, tobacco, alcohol, and other potentially harmful substances.  Get the right amount and quality of sleep. Most adults need 7-9 hours of sleep each night.  Make choices that simplify your life.  Take over-the-counter and prescription medicines only as told by your health care provider.  Avoid caffeine, alcohol, and certain over-the-counter cold medicines. These may make you feel worse. Ask your pharmacist which medicines to avoid.  Keep all follow-up visits as told by your health care provider. This is important. Questions to ask your health care provider  Would I benefit from therapy?  How often should I follow up with a health care provider?  How long do I need to take medicine?  Are there any long-term side effects of my medicine?  Are there any alternatives to taking medicine? Contact a health care provider if:  You have a hard time staying focused or finishing daily tasks.  You spend many hours a day feeling worried about everyday life.  You become exhausted by worry.  You start to have headaches, feel tense, or have nausea.  You urinate more than normal.  You have diarrhea. Get help right away if:  You have a racing heart and shortness of breath.  You have thoughts of hurting yourself or others. If you ever feel like you may hurt yourself or others, or have thoughts about taking your own life, get help right away. You can go to your nearest emergency department or call:   Your local emergency services (911 in the [place]  A suicide crisis helpline, such as the National Suicide Prevention Lifeline at 727-460-24271-845-246-2106. This is open 24-hours a day. Summary  Taking steps to deal with stress can help calm you.  Medicines cannot cure anxiety disorders, but they can help ease symptoms.  Family, friends, and partners can play a big part in helping you recover from an anxiety disorder. This information is not intended to replace advice given to you by your health care provider. Make sure you discuss any questions you have with your health care provider. Document Released: 08/29/2016 Document Revised: 08/17/2017 Document Reviewed: 08/29/2016 Elsevier Patient Education  2020 [employer]

## 2019-04-25 NOTE — Progress Notes (Signed)
Subjective:    Patient ID: Lanelle Bal, female    DOB: [birth year], 33 y.o.   MRN: [id number]  Patient presents today for complete physical   HPI  Sexual History (orientation,birth control, marital status, STD):married, not sexually active, 1 daughter  Depression/Suicide: no improvement, declined referral for CBT, declined medication. Depression screen PHQ 2/9 01/29/2019  Decreased Interest 0  Down, Depressed, Hopeless 2  PHQ - 2 Score 2  Altered sleeping 2  Tired, decreased energy 1  Change in appetite 3  Feeling bad or failure about yourself  2  Trouble concentrating 2  Moving slowly or fidgety/restless 2  Suicidal thoughts 0  PHQ-9 Score 14  Difficult doing work/chores Extremely dIfficult   Vision:no insurance coverage  Dental:no insurance coverage  Immunizations: (TDAP, Hep C screen, Pneumovax, Influenza, zoster)  Health Maintenance  Topic Date Due  . Pap Smear  05/21/2006  . Flu Shot  04/19/2019  . Tetanus Vaccine  04/24/2029  . HIV Screening  Completed   Diet:regular.  Weight:  Wt Readings from Last 3 Encounters:  04/25/19 160 lb 3.2 oz (72.7 kg)  01/31/19 155 lb 12.8 oz (70.7 kg)   Exercise:none  Fall Risk: Fall Risk  01/29/2019  Falls in the past year? 0   Medications and allergies reviewed with patient and updated if appropriate.  Patient Active Problem List   Diagnosis Date Noted  . GAD (generalized anxiety disorder) 02/03/2019  . Depression, major, single episode, severe ([place]) 02/03/2019  . Gastroesophageal reflux disease without esophagitis 02/03/2019    Current Outpatient Medications on File Prior to Visit  Medication Sig Dispense Refill  . ibuprofen (ADVIL) 200 MG tablet Take 1-2 tablets (200-400 mg total) by mouth every 6 (six) hours as needed. 30 tablet 0  . Multiple Vitamin (MULTIVITAMIN) tablet Take 1 tablet by mouth daily.    Marland Kitchen omeprazole (PRILOSEC) 20 MG capsule Take 1 capsule (20 mg total) by mouth daily. (Patient not taking:  Reported on 03/03/2019) 30 capsule 0   No current facility-administered medications on file prior to visit.     History reviewed. No pertinent past medical history.  Past Surgical History:  Procedure Laterality Date  . CESAREAN SECTION  2017    Social History   Socioeconomic History  . Marital status: Married    Spouse name: Not on file  . Number of children: 1  . Years of education: Not on file  . Highest education level: Not on file  Occupational History  . Occupation: Housewife  Social Needs  . Financial resource strain: Not on file  . Food insecurity    Worry: Not on file    Inability: Not on file  . Transportation needs    Medical: Patient refused    Non-medical: Patient refused  Tobacco Use  . Smoking status: Never Smoker  . Smokeless tobacco: Never Used  Substance and Sexual Activity  . Alcohol use: Never    Frequency: Never  . Drug use: Never  . Sexual activity: Not Currently  Lifestyle  . Physical activity    Days per week: 0 days    Minutes per session: Not on file  . Stress: Very much  Relationships  . Social Herbalist on phone: Patient refused    Gets together: Patient refused    Attends religious service: Patient refused    Active member of club or organization: Patient refused    Attends meetings of clubs or organizations: Patient refused    Relationship status: Married  Other Topics Concern  . Not on file  Social History Narrative  . Not on file    Family History  Problem Relation Age of Onset  . Diabetes Maternal Grandmother   . Hypertension Maternal Grandmother   . Sickle cell anemia Daughter 1        Review of Systems  Constitutional: Negative for fever, malaise/fatigue and weight loss.  HENT: Negative for congestion and sore throat.   Eyes:       Negative for visual changes  Respiratory: Negative for cough and shortness of breath.   Cardiovascular: Negative for chest pain, palpitations and leg swelling.   Gastrointestinal: Negative for blood in stool, constipation, diarrhea and heartburn.  Genitourinary: Negative for dysuria, frequency and urgency.  Musculoskeletal: Negative for falls, joint pain and myalgias.  Skin: Negative for rash.  Neurological: Negative for dizziness, sensory change and headaches.  Endo/Heme/Allergies: Does not bruise/bleed easily.  Psychiatric/Behavioral: Positive for depression. Negative for hallucinations, memory loss, substance abuse and suicidal ideas. The patient is nervous/anxious and has insomnia.     Objective:   Vitals:   04/25/19 1515  BP: 124/82  Pulse: 79  Temp: 98.2 F (36.8 C)  SpO2: 100%    Body mass index is 26.69 kg/m.   Physical Examination:  Physical Exam Vitals signs reviewed. Exam conducted with a chaperone present.  Constitutional:      General: She is not in acute distress.    Appearance: Normal appearance.  HENT:     Head: Normocephalic.     Right Ear: Tympanic membrane, ear canal and external ear normal.     Left Ear: Tympanic membrane, ear canal and external ear normal.     Nose: Nose normal.     Mouth/Throat:     Mouth: Mucous membranes are moist.     Dentition: Normal dentition. No gingival swelling or dental abscesses.     Tongue: No lesions. Tongue does not deviate from midline.     Pharynx: No oropharyngeal exudate.     Tonsils: No tonsillar exudate or tonsillar abscesses. 1+ on the right. 1+ on the left.  Eyes:     General: No scleral icterus.    Extraocular Movements: Extraocular movements intact.     Conjunctiva/sclera: Conjunctivae normal.  Neck:     Musculoskeletal: Full passive range of motion without pain. No muscular tenderness.     Thyroid: No thyroid mass, thyromegaly or thyroid tenderness.     Trachea: Trachea normal.  Cardiovascular:     Rate and Rhythm: Normal rate and regular rhythm.     Pulses: Normal pulses.     Heart sounds: Normal heart sounds.  Pulmonary:     Effort: Pulmonary effort is  normal.     Breath sounds: Normal breath sounds.  Chest:     Breasts: Breasts are symmetrical.        Right: Normal. No mass, nipple discharge or skin change.        Left: Normal. No mass, nipple discharge or skin change.  Abdominal:     General: Bowel sounds are normal. There is no distension.     Palpations: Abdomen is soft.     Tenderness: There is no abdominal tenderness.     Hernia: There is no hernia in the left inguinal area or right inguinal area.  Genitourinary:    Pubic Area: No rash.      Labia:        Right: No rash or tenderness.        Left:  No rash or tenderness.      Vagina: Normal. No vaginal discharge.     Cervix: Normal.     Uterus: Normal.      Adnexa: Right adnexa normal and left adnexa normal.     Rectum: External hemorrhoid present.  Musculoskeletal: Normal range of motion.        General: No tenderness.  Lymphadenopathy:     Cervical: No cervical adenopathy.     Right cervical: No superficial, deep or posterior cervical adenopathy.    Left cervical: No superficial, deep or posterior cervical adenopathy.     Upper Body:     Right upper body: No supraclavicular, axillary or pectoral adenopathy.     Left upper body: No supraclavicular, axillary or pectoral adenopathy.     Lower Body: No right inguinal adenopathy. No left inguinal adenopathy.  Skin:    General: Skin is warm and dry.  Neurological:     Mental Status: She is alert and oriented to person, place, and time.  Psychiatric:        Mood and Affect: Affect is blunt and flat.        Speech: Speech normal.        Behavior: Behavior is cooperative.        Cognition and Memory: Cognition normal.        Judgment: Judgment normal.    ASSESSMENT and PLAN:  Jasey was seen today for annual exam.  Diagnoses and all orders for this visit:  Preventative health care -     Comprehensive metabolic panel -     Lipid panel -     HIV Antibody (routine testing w rflx) -     Cytology - PAP( [place] [facility])   Encounter for lipid screening for cardiovascular disease -     Lipid panel  Encounter for screening for human immunodeficiency virus (HIV) -     HIV Antibody (routine testing w rflx)  Encounter for Papanicolaou smear for cervical cancer screening -     Cytology - PAP( [place])  Screen for STD (sexually transmitted disease) -     Cervicovaginal ancillary only( [facility])  Need for diphtheria-tetanus-pertussis (Tdap) vaccine -     Tdap vaccine greater than or equal to 7yo IM    No problem-specific Assessment & Plan notes found for this encounter.      Problem List Items Addressed This Visit    None    Visit Diagnoses    Preventative health care    -  Primary   Relevant Orders   Comprehensive metabolic panel (Completed)   Lipid panel (Completed)   HIV Antibody (routine testing w rflx) (Completed)   Cytology - PAP( [place])   Encounter for lipid screening for cardiovascular disease       Relevant Orders   Lipid panel (Completed)   Encounter for screening for human immunodeficiency virus (HIV)       Relevant Orders   HIV Antibody (routine testing w rflx) (Completed)   Encounter for Papanicolaou smear for cervical cancer screening       Relevant Orders   Cytology - PAP( [place])   Screen for STD (sexually transmitted disease)       Relevant Orders   Cervicovaginal ancillary only( )   Need for diphtheria-tetanus-pertussis (Tdap) vaccine       Relevant Orders   Tdap vaccine greater than or equal to 7yo IM (Completed)       Follow up: Return if symptoms worsen or  fail to improve.  Wilfred Lacy, NP

## 2019-04-26 LAB — COMPREHENSIVE METABOLIC PANEL
AG Ratio: 1.5 (calc) (ref 1.0–2.5)
ALT: 14 U/L (ref 6–29)
AST: 14 U/L (ref 10–30)
Albumin: 4.6 g/dL (ref 3.6–5.1)
Alkaline phosphatase (APISO): 38 U/L (ref 31–125)
BUN: 8 mg/dL (ref 7–25)
CO2: 24 mmol/L (ref 20–32)
Calcium: 10 mg/dL (ref 8.6–10.2)
Chloride: 104 mmol/L (ref 98–110)
Creat: 0.74 mg/dL (ref 0.50–1.10)
Globulin: 3.1 g/dL (calc) (ref 1.9–3.7)
Glucose, Bld: 82 mg/dL (ref 65–99)
Potassium: 4 mmol/L (ref 3.5–5.3)
Sodium: 138 mmol/L (ref 135–146)
Total Bilirubin: 1.5 mg/dL — ABNORMAL HIGH (ref 0.2–1.2)
Total Protein: 7.7 g/dL (ref 6.1–8.1)

## 2019-04-26 LAB — LIPID PANEL
Cholesterol: 165 mg/dL (ref <200)
HDL: 45 mg/dL — ABNORMAL LOW (ref >=50)
LDL Cholesterol (Calc): 108 mg/dL (calc) — ABNORMAL HIGH
Non-HDL Cholesterol (Calc): 120 mg/dL (calc) (ref <130)
Total CHOL/HDL Ratio: 3.7 (calc) (ref <5.0)
Triglycerides: 42 mg/dL (ref <150)

## 2019-04-26 LAB — HIV ANTIBODY (ROUTINE TESTING W REFLEX): HIV 1&2 Ab, 4th Generation: NONREACTIVE

## 2019-04-30 LAB — CYTOLOGY - PAP
Diagnosis: NEGATIVE
HPV: NOT DETECTED

## 2019-05-01 LAB — CERVICOVAGINAL ANCILLARY ONLY
Bacterial vaginitis: NEGATIVE
Candida vaginitis: NEGATIVE
Chlamydia: NEGATIVE
Neisseria Gonorrhea: NEGATIVE
Trichomonas: NEGATIVE

## 2019-12-30 ENCOUNTER — Encounter: Payer: Self-pay | Admitting: Nurse Practitioner

## 2019-12-30 DIAGNOSIS — K219 Gastro-esophageal reflux disease without esophagitis: Secondary | ICD-10-CM

## 2019-12-31 ENCOUNTER — Encounter: Payer: Self-pay | Admitting: Physician Assistant

## 2020-01-14 ENCOUNTER — Encounter: Payer: Self-pay | Admitting: Physician Assistant

## 2020-01-14 ENCOUNTER — Other Ambulatory Visit: Payer: Self-pay

## 2020-01-14 ENCOUNTER — Ambulatory Visit (INDEPENDENT_AMBULATORY_CARE_PROVIDER_SITE_OTHER): Payer: 59 | Admitting: Physician Assistant

## 2020-01-14 ENCOUNTER — Other Ambulatory Visit (INDEPENDENT_AMBULATORY_CARE_PROVIDER_SITE_OTHER): Payer: 59

## 2020-01-14 VITALS — BP 120/80 | HR 72 | Temp 98.3°F | Ht 64.0 in | Wt 166.2 lb

## 2020-01-14 DIAGNOSIS — R1010 Upper abdominal pain, unspecified: Secondary | ICD-10-CM | POA: Diagnosis not present

## 2020-01-14 DIAGNOSIS — R079 Chest pain, unspecified: Secondary | ICD-10-CM

## 2020-01-14 DIAGNOSIS — Z01818 Encounter for other preprocedural examination: Secondary | ICD-10-CM

## 2020-01-14 DIAGNOSIS — R1310 Dysphagia, unspecified: Secondary | ICD-10-CM

## 2020-01-14 DIAGNOSIS — R1013 Epigastric pain: Secondary | ICD-10-CM

## 2020-01-14 LAB — COMPREHENSIVE METABOLIC PANEL
ALT: 13 U/L (ref 0–35)
AST: 15 U/L (ref 0–37)
Albumin: 4.3 g/dL (ref 3.5–5.2)
Alkaline Phosphatase: 37 U/L — ABNORMAL LOW (ref 39–117)
BUN: 6 mg/dL (ref 6–23)
CO2: 27 mEq/L (ref 19–32)
Calcium: 9.3 mg/dL (ref 8.4–10.5)
Chloride: 105 mEq/L (ref 96–112)
Creatinine, Ser: 0.68 mg/dL (ref 0.40–1.20)
GFR: 98.67 mL/min (ref >60.00)
Glucose, Bld: 95 mg/dL (ref 70–99)
Potassium: 3.8 mEq/L (ref 3.5–5.1)
Sodium: 137 mEq/L (ref 135–145)
Total Bilirubin: 1 mg/dL (ref 0.2–1.2)
Total Protein: 7.4 g/dL (ref 6.0–8.3)

## 2020-01-14 LAB — SEDIMENTATION RATE: Sed Rate: 4 mm/hr (ref 0–20)

## 2020-01-14 MED ORDER — PANTOPRAZOLE SODIUM 40 MG PO TBEC: 40.0000 mg | DELAYED_RELEASE_TABLET | Freq: Every morning | ORAL | 3 refills | Status: DC

## 2020-01-14 NOTE — Patient Instructions (Signed)
If you are age 35 or older, your body mass index should be between 23-30. Your Body mass index is 28.54 kg/m. If this is out of the aforementioned range listed, please consider follow up with your Primary Care Provider.  If you are age 4 or younger, your body mass index should be between 19-25. Your Body mass index is 28.54 kg/m. If this is out of the aformentioned range listed, please consider follow up with your Primary Care Provider.   You have been scheduled for an endoscopy. Please follow written instructions given to you at your visit today. If you use inhalers (even only as needed), please bring them with you on the day of your procedure.  Your provider has requested that you go to the basement level for lab work before leaving today. Press "B" on the elevator. The lab is located at the first door on the left as you exit the elevator.  Due to recent changes in healthcare laws, you may see the results of your imaging and laboratory studies on MyChart before your provider has had a chance to review them.  We understand that in some cases there may be results that are confusing or concerning to you. Not all laboratory results come back in the same time frame and the provider may be waiting for multiple results in order to interpret others.  Please give [place] 48 hours in order for your provider to thoroughly review all the results before contacting the office for clarification of your results.   START Pantoprazole 40 mg 1 tablet before breakfast daily.  You have been scheduled for a CT scan of the abdomen and pelvis at [facility] ([address]---this is in the same building as [employer]).   You are scheduled on 4/30 at 1:00. You should arrive 15 minutes prior to your appointment time for registration. Please follow the written instructions below on the day of your exam:  WARNING: IF YOU ARE ALLERGIC TO IODINE/X-RAY DYE, PLEASE NOTIFY RADIOLOGY IMMEDIATELY AT  [phone]! YOU WILL BE GIVEN A 13 HOUR PREMEDICATION PREP.  1) Do not eat or drink anything after 9:00 am (4 hours prior to your test) 2) You have been given 2 bottles of oral contrast to drink. The solution may taste better if refrigerated, but do NOT add ice or any other liquid to this solution. Shake well before drinking.    Drink 1 bottle of contrast @ 11:00 am (2 hours prior to your exam)  Drink 1 bottle of contrast @ 12:00 pm (1 hour prior to your exam)  You may take any medications as prescribed with a small amount of water, if necessary. If you take any of the following medications: METFORMIN, GLUCOPHAGE, GLUCOVANCE, AVANDAMET, RIOMET, FORTAMET, [place] MET, JANUMET, GLUMETZA or METAGLIP, you MAY be asked to HOLD this medication 48 hours AFTER the exam.  The purpose of you drinking the oral contrast is to aid in the visualization of your intestinal tract. The contrast solution may cause some diarrhea. Depending on your individual set of symptoms, you may also receive an intravenous injection of x-ray contrast/dye. Plan on being at [facility] for 30 minutes or longer, depending on the type of exam you are having performed.  This test typically takes 30-45 minutes to complete.  If you have any questions regarding your exam or if you need to reschedule, you may call the CT department at [phone] between the hours of 8:00 am and 5:00 pm, Monday-Friday.  ________________________________________________________________________  Follow up pending the results of your imaging and procedure.

## 2020-01-14 NOTE — Progress Notes (Signed)
Subjective:    Patient ID: Heidi Keller, female    DOB: [birth year], 34 y.o.   MRN: [id number]  HPI Heidi Keller is a pleasant 34 year old female from [place], new to GI today, referred by Wilfred Lacy NP for evaluation of chest and epigastric pain.  Patient has history of anxiety and depression.  She has not had any prior GI evaluation.  She says she has been having problems over the past 7 or 8 months with ongoing fairly constant chest and subxiphoid discomfort and sometimes epigastric discomfort.  She is also had random right upper quadrant and left upper quadrant pains.  She attributes her right upper quadrant pain to her gallbladder and says it sometimes radiates around into her back.  She has been having a lot of burping and belching.  Appetite has been okay, weight has been stable.  She has been having intermittent dysphagia primarily to solids, no episodes requiring regurgitation.  No nausea or vomiting.  Bowel movements have been fairly normal for her, tending more towards constipation.  No melena or hematochezia.  She has been having what she would describe as indigestion. She had undergone upper abdominal ultrasound in August 2020 and was found to have a single small gallstone, no gallbladder wall thickening, CBD 4 mm, liver within normal limits.    She has not tried any over-the-counter medication for her symptoms.  She has used some ginger on occasion. She also is worried about the color of her skin and feels that her palms are yellowish.  She has noticed this over the past couple of months. No recent labs.  Review of Systems Pertinent positive and negative review of systems were noted in the above HPI section.  All other review of systems was otherwise negative.  Outpatient Encounter Medications as of 01/14/2020  Medication Sig  . Multiple Vitamin (MULTIVITAMIN) tablet Take 1 tablet by mouth daily.  . pantoprazole (PROTONIX) 40 MG tablet Take 1 tablet (40 mg total) by mouth in the  morning.  . [DISCONTINUED] ibuprofen (ADVIL) 200 MG tablet Take 1-2 tablets (200-400 mg total) by mouth every 6 (six) hours as needed.  . [DISCONTINUED] omeprazole (PRILOSEC) 20 MG capsule Take 1 capsule (20 mg total) by mouth daily. (Patient not taking: Reported on 03/03/2019)   No facility-administered encounter medications on file as of 01/14/2020.   No Known Allergies Patient Active Problem List   Diagnosis Date Noted  . GAD (generalized anxiety disorder) 02/03/2019  . Depression, major, single episode, severe ([place]) 02/03/2019  . Gastroesophageal reflux disease without esophagitis 02/03/2019   Social History   Socioeconomic History  . Marital status: Married    Spouse name: Not on file  . Number of children: 1  . Years of education: Not on file  . Highest education level: Not on file  Occupational History  . Occupation: Housewife  Tobacco Use  . Smoking status: Never Smoker  . Smokeless tobacco: Never Used  Substance and Sexual Activity  . Alcohol use: Never  . Drug use: Never  . Sexual activity: Not Currently  Other Topics Concern  . Not on file  Social History Narrative  . Not on file   Social Determinants of Health   Financial Resource Strain:   . Difficulty of Paying Living Expenses:   Food Insecurity:   . Worried About Charity fundraiser in the Last Year:   . Arboriculturist in the Last Year:   Transportation Needs: Unknown  . Lack of Transportation (Medical): Patient refused  .  Lack of Transportation (Non-Medical): Patient refused  Physical Activity: Unknown  . Days of Exercise per Week: 0 days  . Minutes of Exercise per Session: Not on file  Stress: Stress Concern Present  . Feeling of Stress : Very much  Social Connections: Unknown  . Frequency of Communication with Friends and Family: Patient refused  . Frequency of Social Gatherings with Friends and Family: Patient refused  . Attends Religious Services: Patient refused  . Active Member of Clubs or  Organizations: Patient refused  . Attends Archivist Meetings: Patient refused  . Marital Status: Married  Human resources officer Violence: Unknown  . Fear of Current or Ex-Partner: Not asked  . Emotionally Abused: Not asked  . Physically Abused: Not asked  . Sexually Abused: Not asked    Ms. Clowdus's family history includes Diabetes in her maternal grandmother; Hypertension in her maternal grandmother; Sickle cell anemia (age of onset: 48) in her daughter.      Objective:    Vitals:   01/14/20 1009  BP: 120/80  Pulse: 72  Temp: 98.3 F (36.8 C)    Physical Exam Well-developed well-nourished  African female  in no acute distress.  Accompanied by her husband  Weight, 166 BMI 28.5  HEENT; nontraumatic normocephalic, EOMI, PE RR LA, sclera anicteric. Oropharynx; not examined today Neck; supple, no JVD Cardiovascular; regular rate and rhythm with S1-S2, no murmur rub or gallop Pulmonary; Clear bilaterally Abdomen; soft, very mild tenderness in the epigastrium no guarding, nondistended, no palpable mass or hepatosplenomegaly, bowel sounds are active Rectal; not done today Skin; benign exam, no jaundice rash or appreciable lesions Extremities; no clubbing cyanosis or edema skin warm and dry Neuro/Psych; alert and oriented x4, grossly nonfocal mood and affect appropriate       Assessment & Plan:   #3 34 year old African female with 7 to 43-monthhistory of daily indigestion, gas belching and subxiphoid discomfort.  She has also been having intermittent solid food dysphagia.  Patient also describes intermittent right upper quadrant and left upper quadrant abdominal pains  Etiology of her current symptoms is not entirely clear, suspect she has been having acid reflux, rule out esophagitis, rule out esophageal stricture, rule out motility disorder.  Rule out chronic gastropathy, with right upper quadrant and left upper quadrant pain, rule out other intra-abdominal inflammatory  process.  #2 Single gallstone found on ultrasound 04/2019 unclear that this is symptomatic  #3 history of anxiety/depression  Plan; Start Protonix 40 mg 1 p.o. every morning AC breakfast CBC with differential, c-Met, sed rate Patient will be scheduled for CT scan of the abdomen and pelvis with contrast  We will also proceed with upper endoscopy with possible esophageal dilation with Dr. NSilverio Decamp.  Procedure was discussed in detail with the patient including indications risks and benefits and she is agreeable to proceed. Further plans pending results of above.    (Dr. NSilverio Decamp not supervising however had soonest availability)    Waverly Tarquinio SGenia HaroldPA-C 01/14/2020   Cc: NFlossie Buffy NP

## 2020-01-15 LAB — CBC WITH DIFFERENTIAL/PLATELET
Basophils Absolute: 0 10*3/uL (ref 0.0–0.1)
Basophils Relative: 1 % (ref 0.0–3.0)
Eosinophils Absolute: 0 10*3/uL (ref 0.0–0.7)
Eosinophils Relative: 0.6 % (ref 0.0–5.0)
HCT: 37.5 % (ref 36.0–46.0)
Hemoglobin: 12.7 g/dL (ref 12.0–15.0)
Lymphocytes Relative: 32.7 % (ref 12.0–46.0)
Lymphs Abs: 1.5 10*3/uL (ref 0.7–4.0)
MCHC: 33.7 g/dL (ref 30.0–36.0)
MCV: 91.3 fl (ref 78.0–100.0)
Monocytes Absolute: 0.3 10*3/uL (ref 0.1–1.0)
Monocytes Relative: 6.6 % (ref 3.0–12.0)
Neutro Abs: 2.6 10*3/uL (ref 1.4–7.7)
Neutrophils Relative %: 59.1 % (ref 43.0–77.0)
Platelets: 218 10*3/uL (ref 150.0–400.0)
RBC: 4.11 Mil/uL (ref 3.87–5.11)
RDW: 13.2 % (ref 11.5–15.5)
WBC: 4.5 10*3/uL (ref 4.0–10.5)

## 2020-01-16 ENCOUNTER — Other Ambulatory Visit: Payer: 59

## 2020-01-19 ENCOUNTER — Ambulatory Visit (INDEPENDENT_AMBULATORY_CARE_PROVIDER_SITE_OTHER)
Admission: RE | Admit: 2020-01-19 | Discharge: 2020-01-19 | Disposition: A | Discharge status: Home / Self Care | Payer: 59 | Source: Ambulatory Visit | Attending: Physician Assistant | Admitting: Physician Assistant

## 2020-01-19 ENCOUNTER — Other Ambulatory Visit: Payer: Self-pay

## 2020-01-19 DIAGNOSIS — R1310 Dysphagia, unspecified: Secondary | ICD-10-CM

## 2020-01-19 DIAGNOSIS — R1013 Epigastric pain: Secondary | ICD-10-CM | POA: Diagnosis not present

## 2020-01-19 DIAGNOSIS — R1010 Upper abdominal pain, unspecified: Secondary | ICD-10-CM | POA: Diagnosis not present

## 2020-01-19 DIAGNOSIS — R079 Chest pain, unspecified: Secondary | ICD-10-CM

## 2020-01-19 MED ORDER — IOHEXOL 300 MG/ML  SOLN
100.0000 mL | Freq: Once | INTRAMUSCULAR | Status: AC | PRN
  Administered 2020-01-19: 100 mL via INTRAVENOUS

## 2020-01-20 NOTE — Progress Notes (Signed)
Reviewed and agree with documentation and assessment and plan. K. Veena Aryahi Denzler , MD   

## 2020-01-26 ENCOUNTER — Other Ambulatory Visit: Payer: Self-pay | Admitting: Gastroenterology

## 2020-01-26 ENCOUNTER — Ambulatory Visit (INDEPENDENT_AMBULATORY_CARE_PROVIDER_SITE_OTHER): Payer: 59

## 2020-01-26 DIAGNOSIS — Z1159 Encounter for screening for other viral diseases: Secondary | ICD-10-CM

## 2020-01-26 LAB — SARS CORONAVIRUS 2 (TAT 6-24 HRS): SARS Coronavirus 2: NEGATIVE

## 2020-01-28 ENCOUNTER — Ambulatory Visit (AMBULATORY_SURGERY_CENTER): Payer: 59 | Admitting: Gastroenterology

## 2020-01-28 ENCOUNTER — Encounter: Payer: Self-pay | Admitting: Gastroenterology

## 2020-01-28 ENCOUNTER — Other Ambulatory Visit: Payer: Self-pay

## 2020-01-28 VITALS — BP 110/57 | HR 73 | Temp 95.9°F | Resp 14 | Ht 64.0 in | Wt 166.0 lb

## 2020-01-28 DIAGNOSIS — R1010 Upper abdominal pain, unspecified: Secondary | ICD-10-CM

## 2020-01-28 DIAGNOSIS — K3189 Other diseases of stomach and duodenum: Secondary | ICD-10-CM | POA: Diagnosis not present

## 2020-01-28 DIAGNOSIS — K2100 Gastro-esophageal reflux disease with esophagitis, without bleeding: Secondary | ICD-10-CM

## 2020-01-28 DIAGNOSIS — R1013 Epigastric pain: Secondary | ICD-10-CM

## 2020-01-28 DIAGNOSIS — R1310 Dysphagia, unspecified: Secondary | ICD-10-CM | POA: Diagnosis not present

## 2020-01-28 MED ORDER — OMEPRAZOLE 40 MG PO CPDR: 40.0000 mg | DELAYED_RELEASE_CAPSULE | Freq: Every day | ORAL | 3 refills | Status: DC

## 2020-01-28 MED ORDER — SODIUM CHLORIDE 0.9 % IV SOLN: 500.0000 mL | INTRAVENOUS | Status: DC

## 2020-01-28 NOTE — Patient Instructions (Signed)
Handouts given for gastritis and antireflux measures (GERD).  New Rx for Prilosec, take 40mg  daily 30 min before breakfast for 3 months.  Office visit in 3 months.  YOU HAD AN ENDOSCOPIC PROCEDURE TODAY AT THE [facility]:   Refer to the procedure report that was given to you for any specific questions about what was found during the examination.  If the procedure report does not answer your questions, please call your gastroenterologist to clarify.  If you requested that your care partner not be given the details of your procedure findings, then the procedure report has been included in a sealed envelope for you to review at your convenience later.  YOU SHOULD EXPECT: Some feelings of bloating in the abdomen. Passage of more gas than usual.  Walking can help get rid of the air that was put into your GI tract during the procedure and reduce the bloating. If you had a lower endoscopy (such as a colonoscopy or flexible sigmoidoscopy) you may notice spotting of blood in your stool or on the toilet paper. If you underwent a bowel prep for your procedure, you may not have a normal bowel movement for a few days.  Please Note:  You might notice some irritation and congestion in your nose or some drainage.  This is from the oxygen used during your procedure.  There is no need for concern and it should clear up in a day or so.  SYMPTOMS TO REPORT IMMEDIATELY:   Following upper endoscopy (EGD)  Vomiting of blood or coffee ground material  New chest pain or pain under the shoulder blades  Painful or persistently difficult swallowing  New shortness of breath  Fever of 100F or higher  Black, tarry-looking stools  For urgent or emergent issues, a gastroenterologist can be reached at any hour by calling [phone]. Do not use MyChart messaging for urgent concerns.    DIET:  We do recommend a small meal at first, but then you may proceed to your regular diet.  Drink plenty of fluids but  you should avoid alcoholic beverages for 24 hours.  ACTIVITY:  You should plan to take it easy for the rest of today and you should NOT DRIVE or use heavy machinery until tomorrow (because of the sedation medicines used during the test).    FOLLOW UP: Our staff will call the number listed on your records 48-72 hours following your procedure to check on you and address any questions or concerns that you may have regarding the information given to you following your procedure. If we do not reach you, we will leave a message.  We will attempt to reach you two times.  During this call, we will ask if you have developed any symptoms of COVID 19. If you develop any symptoms (ie: fever, flu-like symptoms, shortness of breath, cough etc.) before then, please call [phone].  If you test positive for Covid 19 in the 2 weeks post procedure, please call and report this information to [phone].    If any biopsies were taken you will be contacted by phone or by letter within the next 1-3 weeks.  Please call us at [phone] if you have not heard about the biopsies in 3 weeks.    SIGNATURES/CONFIDENTIALITY: You and/or your care partner have signed paperwork which will be entered into your electronic medical record.  These signatures attest to the fact that that the information above on your After Visit Summary has been reviewed and is understood.  Full responsibility of the confidentiality of this discharge information lies with you and/or your care-partner.

## 2020-01-28 NOTE — Op Note (Addendum)
[facility] Patient Name: Heidi Keller Procedure Date: 01/28/2020 9:57 AM MRN: [id number] Endoscopist: Napoleon Form , MD Age: 34 Referring MD:  Date of Birth: 08-27-1986 Gender: Female Account #: [id number] Procedure:                Upper GI endoscopy Indications:              Dysphagia, Epigastric abdominal pain, Dyspepsia Medicines:                Monitored Anesthesia Care Procedure:                Pre-Anesthesia Assessment:                           - Prior to the procedure, a History and Physical                            was performed, and patient medications and                            allergies were reviewed. The patient's tolerance of                            previous anesthesia was also reviewed. The risks                            and benefits of the procedure and the sedation                            options and risks were discussed with the patient.                            All questions were answered, and informed consent                            was obtained. Prior Anticoagulants: The patient has                            taken no previous anticoagulant or antiplatelet                            agents. ASA Grade Assessment: II - A patient with                            mild systemic disease. After reviewing the risks                            and benefits, the patient was deemed in                            satisfactory condition to undergo the procedure.                           After obtaining informed consent, the endoscope was  passed under direct vision. Throughout the                            procedure, the patient's blood pressure, pulse, and                            oxygen saturations were monitored continuously. The                            Endoscope was introduced through the mouth, and                            advanced to the second part of duodenum. The upper   GI endoscopy was accomplished without difficulty.                            The patient tolerated the procedure well. Scope In: Scope Out: Findings:                 LA Grade B (one or more mucosal breaks greater than                            5 mm, not extending between the tops of two mucosal                            folds) esophagitis was found 34 to 35 cm from the                            incisors. Biopsies were taken with a cold forceps                            for histology.                           Patchy mild inflammation characterized by                            congestion (edema) and erythema was found in the                            entire examined stomach. Biopsies were taken with a                            cold forceps for Helicobacter pylori testing.                           The examined duodenum was normal. Complications:            No immediate complications. Estimated Blood Loss:     Estimated blood loss was minimal. Impression:               - LA Grade B reflux esophagitis. Biopsied.                           - Gastritis. Biopsied.                           -  Normal examined duodenum. Recommendation:           - Patient has a contact number available for                            emergencies. The signs and symptoms of potential                            delayed complications were discussed with the                            patient. Return to normal activities tomorrow.                            Written discharge instructions were provided to the                            patient.                           - Resume previous diet.                           - Continue present medications.                           - Await pathology results.                           - Return to GI office in 3 months.                           - Follow an antireflux regimen indefinitely.                           - Use Prilosec (omeprazole) 40 mg PO daily for 3                             months, 30 minutes before breakfast.                           - If continues to have persistent symptoms will                            consider referral to [facility] for cholecystectomy for                            symptomatic cholelithiasis Mauri Pole, MD 01/28/2020 10:24:50 AM This report has been signed electronically.

## 2020-01-28 NOTE — Progress Notes (Signed)
pt tolerated well. VSS. awake and to recovery. Report given to RN.  

## 2020-01-28 NOTE — Progress Notes (Signed)
Called to room to assist during endoscopic procedure.  Patient ID and intended procedure confirmed with present staff. Received instructions for my participation in the procedure from the performing physician.  

## 2020-01-30 ENCOUNTER — Other Ambulatory Visit: Payer: Self-pay

## 2020-01-30 ENCOUNTER — Telehealth: Payer: Self-pay

## 2020-01-30 MED ORDER — OMEPRAZOLE 40 MG PO CPDR: 40.0000 mg | DELAYED_RELEASE_CAPSULE | Freq: Every day | ORAL | 1 refills | Status: DC

## 2020-01-30 NOTE — Telephone Encounter (Signed)
  Follow up Call-  Call back number 01/28/2020  Post procedure Call Back phone  # [phone]  Permission to leave phone message Yes     Patient questions:  Do you have a fever, pain , or abdominal swelling? No. Pain Score  0 *  Have you tolerated food without any problems? Yes.    Have you been able to return to your normal activities? Yes.    Do you have any questions about your discharge instructions: Diet   No. Medications  No. Follow up visit  No.  Do you have questions or concerns about your Care? No.  Actions: * If pain score is 4 or above: No action needed, pain <4.  1. Have you developed a fever since your procedure? no  2.   Have you had an respiratory symptoms (SOB or cough) since your procedure? no  3.   Have you tested positive for COVID 19 since your procedure no  4.   Have you had any family members/close contacts diagnosed with the COVID 19 since your procedure?  no   If yes to any of these questions please route to Laverna Peace, RN and Charlett Lango, RN

## 2020-02-02 ENCOUNTER — Other Ambulatory Visit: Payer: Self-pay

## 2020-02-05 ENCOUNTER — Encounter: Payer: Self-pay | Admitting: Gastroenterology

## 2020-02-16 ENCOUNTER — Emergency Department (HOSPITAL_COMMUNITY): Payer: 59

## 2020-02-16 ENCOUNTER — Other Ambulatory Visit: Payer: Self-pay

## 2020-02-16 ENCOUNTER — Encounter (HOSPITAL_COMMUNITY): Payer: Self-pay | Admitting: Emergency Medicine

## 2020-02-16 ENCOUNTER — Emergency Department (HOSPITAL_COMMUNITY)
Admission: EM | Admit: 2020-02-16 | Discharge: 2020-02-17 | Disposition: A | Discharge status: Home / Self Care | Payer: 59 | Attending: Emergency Medicine | Admitting: Emergency Medicine

## 2020-02-16 DIAGNOSIS — R0789 Other chest pain: Secondary | ICD-10-CM | POA: Diagnosis not present

## 2020-02-16 DIAGNOSIS — K2900 Acute gastritis without bleeding: Secondary | ICD-10-CM | POA: Diagnosis not present

## 2020-02-16 DIAGNOSIS — R1013 Epigastric pain: Secondary | ICD-10-CM | POA: Insufficient documentation

## 2020-02-16 DIAGNOSIS — R110 Nausea: Secondary | ICD-10-CM | POA: Diagnosis not present

## 2020-02-16 LAB — CBC
HCT: 36 % (ref 36.0–46.0)
Hemoglobin: 12.4 g/dL (ref 12.0–15.0)
MCH: 30.2 pg (ref 26.0–34.0)
MCHC: 34.4 g/dL (ref 30.0–36.0)
MCV: 87.8 fL (ref 80.0–100.0)
Platelets: 161 10*3/uL (ref 150–400)
RBC: 4.1 MIL/uL (ref 3.87–5.11)
RDW: 12.3 % (ref 11.5–15.5)
WBC: 3.2 10*3/uL — ABNORMAL LOW (ref 4.0–10.5)
nRBC: 0 % (ref 0.0–0.2)

## 2020-02-16 LAB — I-STAT BETA HCG BLOOD, ED (MC, WL, AP ONLY): I-stat hCG, quantitative: <5 m[IU]/mL (ref <5)

## 2020-02-16 MED ORDER — SODIUM CHLORIDE 0.9% FLUSH: 3.0000 mL | Freq: Once | INTRAVENOUS | Status: DC

## 2020-02-16 NOTE — ED Triage Notes (Signed)
Patient reports intermittent pain across her chest for > 1 year , no SOB , denies emesis or diaphoresis . No cough or fever .

## 2020-02-17 ENCOUNTER — Emergency Department (HOSPITAL_COMMUNITY): Payer: 59

## 2020-02-17 DIAGNOSIS — R1013 Epigastric pain: Secondary | ICD-10-CM | POA: Diagnosis not present

## 2020-02-17 LAB — PROTIME-INR
INR: 1.2 (ref 0.8–1.2)
Prothrombin Time: 14.4 seconds (ref 11.4–15.2)

## 2020-02-17 LAB — HEPATIC FUNCTION PANEL
ALT: 17 U/L (ref 0–44)
AST: 24 U/L (ref 15–41)
Albumin: 4.5 g/dL (ref 3.5–5.0)
Alkaline Phosphatase: 31 U/L — ABNORMAL LOW (ref 38–126)
Bilirubin, Direct: 0.2 mg/dL (ref 0.0–0.2)
Indirect Bilirubin: 2 mg/dL — ABNORMAL HIGH (ref 0.3–0.9)
Total Bilirubin: 2.2 mg/dL — ABNORMAL HIGH (ref 0.3–1.2)
Total Protein: 7.9 g/dL (ref 6.5–8.1)

## 2020-02-17 LAB — BASIC METABOLIC PANEL
Anion gap: 13 (ref 5–15)
BUN: <5 mg/dL — ABNORMAL LOW (ref 6–20)
CO2: 23 mmol/L (ref 22–32)
Calcium: 9.5 mg/dL (ref 8.9–10.3)
Chloride: 102 mmol/L (ref 98–111)
Creatinine, Ser: 0.69 mg/dL (ref 0.44–1.00)
GFR calc Af Amer: >60 mL/min (ref >60)
GFR calc non Af Amer: >60 mL/min (ref >60)
Glucose, Bld: 91 mg/dL (ref 70–99)
Potassium: 3.5 mmol/L (ref 3.5–5.1)
Sodium: 138 mmol/L (ref 135–145)

## 2020-02-17 LAB — TROPONIN I (HIGH SENSITIVITY)
Troponin I (High Sensitivity): <2 ng/L (ref <18)
Troponin I (High Sensitivity): 3 ng/L (ref <18)

## 2020-02-17 LAB — LIPASE, BLOOD: Lipase: 19 U/L (ref 11–51)

## 2020-02-17 MED ORDER — LIDOCAINE VISCOUS HCL 2 % MT SOLN
15.0000 mL | Freq: Once | OROMUCOSAL | Status: AC
  Administered 2020-02-17: 15 mL via ORAL
  Filled 2020-02-17: qty 15

## 2020-02-17 MED ORDER — ONDANSETRON 4 MG PO TBDP
4.0000 mg | ORAL_TABLET | Freq: Three times a day (TID) | ORAL | 0 refills | Status: DC | PRN
Start: 2020-02-17 — End: 2020-05-17

## 2020-02-17 MED ORDER — ALUM & MAG HYDROXIDE-SIMETH 200-200-20 MG/5ML PO SUSP
30.0000 mL | Freq: Once | ORAL | Status: AC
  Administered 2020-02-17: 30 mL via ORAL
  Filled 2020-02-17: qty 30

## 2020-02-17 MED ORDER — SUCRALFATE 1 G PO TABS
1.0000 g | ORAL_TABLET | Freq: Once | ORAL | Status: AC
  Administered 2020-02-17: 1 g via ORAL
  Filled 2020-02-17: qty 1

## 2020-02-17 MED ORDER — SUCRALFATE 1 G PO TABS
1.0000 g | ORAL_TABLET | Freq: Three times a day (TID) | ORAL | 0 refills | Status: DC
Start: 2020-02-17 — End: 2020-02-27

## 2020-02-17 NOTE — ED Notes (Signed)
Patient is out of room in [place] at this time.

## 2020-02-17 NOTE — ED Provider Notes (Signed)
[place] EMERGENCY DEPARTMENT Provider Note   CSN: [id number] Arrival date & time: 02/16/20  2228     History Chief Complaint  Patient presents with  . Chest Pain    Heidi Keller is a 34 y.o. female.  The history is provided by the patient and medical records.    34 y.o. F with hx of anxiety, gallstones, GERD, presenting to the ED for chest/upper abdominal pain.  Patient states she has been having issues for over a year now.  She states she has had multiple prior cardiac evaluations which were all normal.  States she was recently sent to GI for follow-up and had CT scan and upper endoscopy-- did have findings of esophagitis, gastritis.  She was started on protonix but states no real relief from this.  She states she continues having lower chest pain, some nausea, intermittent burning/gassy sensation.  She denies vomiting.  States her appetite has been decreased because she has increased pain with eating.  She denies any known cardiac history.  She is not a smoker.    Past Medical History:  Diagnosis Date  . Anxiety   . Gallstones     Patient Active Problem List   Diagnosis Date Noted  . GAD (generalized anxiety disorder) 02/03/2019  . Depression, major, single episode, severe (Derma) 02/03/2019  . Gastroesophageal reflux disease without esophagitis 02/03/2019    Past Surgical History:  Procedure Laterality Date  . CESAREAN SECTION  2017     OB History   No obstetric history on file.     Family History  Problem Relation Age of Onset  . Diabetes Maternal Grandmother   . Hypertension Maternal Grandmother   . Sickle cell anemia Daughter 1  . Colon cancer Neg Hx   . Esophageal cancer Neg Hx   . Rectal cancer Neg Hx   . Stomach cancer Neg Hx     Social History   Tobacco Use  . Smoking status: Never Smoker  . Smokeless tobacco: Never Used  Substance Use Topics  . Alcohol use: Never  . Drug use: Never    Home Medications Prior to  Admission medications   Medication Sig Start Date End Date Taking? Authorizing Provider  Multiple Vitamin (MULTIVITAMIN) tablet Take 1 tablet by mouth daily.    [provider]  omeprazole (PRILOSEC) 40 MG capsule Take 1 capsule (40 mg total) by mouth daily. For 3 months 01/30/20   Mauri Pole, MD    Allergies    Patient has no known allergies.  Review of Systems   Review of Systems  Gastrointestinal: Positive for abdominal pain and nausea.  All other systems reviewed and are negative.   Physical Exam Updated Vital Signs BP 126/66   Pulse 64   Temp 98.8 F (37.1 C) (Oral)   Resp 20   Ht 5\' 4"  (1.626 m)   Wt 82 kg   LMP 01/24/2020 (Exact Date)   SpO2 100%   BMI 31.03 kg/m   Physical Exam Vitals and nursing note reviewed.  Constitutional:      Appearance: She is well-developed.     Comments: Sleeping, awoken for exam  HENT:     Head: Normocephalic and atraumatic.  Eyes:     Conjunctiva/sclera: Conjunctivae normal.     Pupils: Pupils are equal, round, and reactive to light.  Cardiovascular:     Rate and Rhythm: Normal rate and regular rhythm.     Heart sounds: Normal heart sounds.  Pulmonary:  Effort: Pulmonary effort is normal.     Breath sounds: Normal breath sounds. No decreased breath sounds or wheezing.  Chest:     Comments: Chest wall non-tender Abdominal:     General: Bowel sounds are normal.     Palpations: Abdomen is soft.     Tenderness: There is no abdominal tenderness. There is no guarding or rebound.     Comments: Soft, grossly non-tender  Musculoskeletal:        General: Normal range of motion.     Cervical back: Normal range of motion.  Skin:    General: Skin is warm and dry.  Neurological:     Mental Status: She is alert and oriented to person, place, and time.     ED Results / Procedures / Treatments   Labs (all labs ordered are listed, but only abnormal results are displayed) Labs Reviewed  BASIC METABOLIC PANEL -  Abnormal; Notable for the following components:      Result Value   BUN <5 (*)    All other components within normal limits  CBC - Abnormal; Notable for the following components:   WBC 3.2 (*)    All other components within normal limits  HEPATIC FUNCTION PANEL - Abnormal; Notable for the following components:   Alkaline Phosphatase 31 (*)    Total Bilirubin 2.2 (*)    Indirect Bilirubin 2.0 (*)    All other components within normal limits  PROTIME-INR  LIPASE, BLOOD  I-STAT BETA HCG BLOOD, ED ([facility], [facility], [facility] ONLY)  TROPONIN I (HIGH SENSITIVITY)  TROPONIN I (HIGH SENSITIVITY)    EKG None  Radiology DG Chest 2 View  Result Date: 02/16/2020 CLINICAL DATA:  Chest pain EXAM: CHEST - 2 VIEW COMPARISON:  March 03, 2019 FINDINGS: The heart size and mediastinal contours are within normal limits. Both lungs are clear. The visualized skeletal structures are unremarkable. IMPRESSION: No active cardiopulmonary disease. Electronically Signed   By: Jonna Clark M.D.   On: 02/16/2020 23:29   US Abdomen Limited RUQ  Result Date: 02/17/2020 CLINICAL DATA:  Right upper quadrant EXAM: ULTRASOUND ABDOMEN LIMITED RIGHT UPPER QUADRANT COMPARISON:  Jan 19, 2020 FINDINGS: Gallbladder: Layering calcified gallstones are seen the largest measuring 5 mm. No gallbladder wall thickening or pericholecystic fluid. Common bile duct: Diameter:   2.3 mm Liver: No focal lesion identified. Within normal limits in parenchymal echogenicity. Portal vein is patent on color Doppler imaging with normal direction of blood flow towards the liver. Other: None. IMPRESSION: Cholelithiasis without evidence of acute cholecystitis. Electronically Signed   By: Jonna Clark M.D.   On: 02/17/2020 04:58    Procedures Procedures (including critical care time)  Medications Ordered in ED Medications  sodium chloride flush (NS) 0.9 % injection 3 mL (has no administration in time range)    ED Course  I have reviewed the triage vital signs  and the nursing notes.  Pertinent labs & imaging results that were available during my care of the patient were reviewed by me and considered in my medical decision making (see chart for details).    MDM Rules/Calculators/A&P  34 year old female presenting to the ED with chest pain.  Has had intermittent issues with this for about a year now.  She has had prior cardiac evaluations that were all normal.  Recently seen by GI Dr. With endoscopy and found to have gallstones as well as gastritis/esophagitis.  She was started on Protonix but has not had any significant relief.  She does report pain  when eating and some nausea.  She is not had any vomiting.  Screening labs are overall reassuring including troponin x2.  Chest x-ray is clear.  On exam, her symptoms do seem more GI related.  Her abdomen is soft and benign.  Added on LFT's and lipase along with RUQ [place].  Given carafate and GI cocktail.  LFT's reassuring.  Bili mildly elevated but similar to prior.  [place] with findings of cholelithiasis but no findings of cholecystitis.  She is feeling better after Carafate and GI cocktail.  She has been tolerating fluids here without pain.  Feel she is stable for discharge home.  Suspect this is related to her reflux/gastritis.  Will d/c home with a few days of carafate.  Will have her follow-up with GI.  She may return here for any new/acute changes.  Final Clinical Impression(s) / ED Diagnoses Final diagnoses:  Epigastric pain  Acute gastritis without hemorrhage, unspecified gastritis type    Rx / DC Orders ED Discharge Orders         Ordered    sucralfate (CARAFATE) 1 g tablet  3 times daily with meals & bedtime     02/17/20 0608    ondansetron (ZOFRAN ODT) 4 MG disintegrating tablet  Every 8 hours PRN     02/17/20 0608           Garlon Hatchet, PA-C 02/17/20 2671    Gilda Crease, MD 02/17/20 [phone]

## 2020-02-17 NOTE — Discharge Instructions (Addendum)
Take the prescribed medication as directed.  Watch diet-- avoid spicy, acidic foods as it can worsen acid reflux. Follow-up with your GI physician.   Return to the ED for new or worsening symptoms.

## 2020-02-18 ENCOUNTER — Telehealth: Payer: Self-pay | Admitting: Gastroenterology

## 2020-02-18 NOTE — Telephone Encounter (Signed)
See the ED note. I spoke with the patient and advised her to continue the Protonix and to space it by several hours from the Carafate. Please advise.

## 2020-02-19 NOTE — Telephone Encounter (Signed)
Agree, if persistent symptoms will consider HIDA scan to exclude gallbladder dysfunction/chronic cholecystitis. H/o gallstones. F/u office visit with APP next available. Thanks

## 2020-02-20 NOTE — Telephone Encounter (Signed)
Spoke with this very kind patient. She will call and ask for me if she does not have improvement in her symptoms by next week.

## 2020-02-27 ENCOUNTER — Other Ambulatory Visit: Payer: Self-pay

## 2020-02-27 ENCOUNTER — Emergency Department (HOSPITAL_COMMUNITY)
Admission: EM | Admit: 2020-02-27 | Discharge: 2020-02-28 | Disposition: A | Discharge status: Home / Self Care | Payer: 59 | Attending: Emergency Medicine | Admitting: Emergency Medicine

## 2020-02-27 DIAGNOSIS — R0789 Other chest pain: Secondary | ICD-10-CM | POA: Diagnosis not present

## 2020-02-27 DIAGNOSIS — R5383 Other fatigue: Secondary | ICD-10-CM | POA: Diagnosis not present

## 2020-02-27 DIAGNOSIS — R109 Unspecified abdominal pain: Secondary | ICD-10-CM | POA: Diagnosis present

## 2020-02-27 DIAGNOSIS — Z79899 Other long term (current) drug therapy: Secondary | ICD-10-CM | POA: Diagnosis not present

## 2020-02-27 DIAGNOSIS — R17 Unspecified jaundice: Secondary | ICD-10-CM | POA: Diagnosis not present

## 2020-02-27 DIAGNOSIS — R630 Anorexia: Secondary | ICD-10-CM | POA: Diagnosis not present

## 2020-02-27 DIAGNOSIS — R42 Dizziness and giddiness: Secondary | ICD-10-CM | POA: Diagnosis not present

## 2020-02-27 DIAGNOSIS — R1084 Generalized abdominal pain: Secondary | ICD-10-CM | POA: Insufficient documentation

## 2020-02-27 LAB — URINALYSIS, ROUTINE W REFLEX MICROSCOPIC
Bilirubin Urine: NEGATIVE
Glucose, UA: NEGATIVE mg/dL
Hgb urine dipstick: NEGATIVE
Ketones, ur: NEGATIVE mg/dL
Leukocytes,Ua: NEGATIVE
Nitrite: NEGATIVE
Protein, ur: NEGATIVE mg/dL
Specific Gravity, Urine: 1.003 — ABNORMAL LOW (ref 1.005–1.030)
pH: 9 — ABNORMAL HIGH (ref 5.0–8.0)

## 2020-02-27 LAB — COMPREHENSIVE METABOLIC PANEL
ALT: 16 U/L (ref 0–44)
AST: 21 U/L (ref 15–41)
Albumin: 4.7 g/dL (ref 3.5–5.0)
Alkaline Phosphatase: 33 U/L — ABNORMAL LOW (ref 38–126)
Anion gap: 12 (ref 5–15)
BUN: <5 mg/dL — ABNORMAL LOW (ref 6–20)
CO2: 23 mmol/L (ref 22–32)
Calcium: 10.4 mg/dL — ABNORMAL HIGH (ref 8.9–10.3)
Chloride: 107 mmol/L (ref 98–111)
Creatinine, Ser: 0.72 mg/dL (ref 0.44–1.00)
GFR calc Af Amer: >60 mL/min (ref >60)
GFR calc non Af Amer: >60 mL/min (ref >60)
Glucose, Bld: 113 mg/dL — ABNORMAL HIGH (ref 70–99)
Potassium: 4 mmol/L (ref 3.5–5.1)
Sodium: 142 mmol/L (ref 135–145)
Total Bilirubin: 2.4 mg/dL — ABNORMAL HIGH (ref 0.3–1.2)
Total Protein: 7.6 g/dL (ref 6.5–8.1)

## 2020-02-27 LAB — CBC
HCT: 36.7 % (ref 36.0–46.0)
Hemoglobin: 12.4 g/dL (ref 12.0–15.0)
MCH: 29.9 pg (ref 26.0–34.0)
MCHC: 33.8 g/dL (ref 30.0–36.0)
MCV: 88.4 fL (ref 80.0–100.0)
Platelets: 220 10*3/uL (ref 150–400)
RBC: 4.15 MIL/uL (ref 3.87–5.11)
RDW: 12.5 % (ref 11.5–15.5)
WBC: 4.8 10*3/uL (ref 4.0–10.5)
nRBC: 0 % (ref 0.0–0.2)

## 2020-02-27 LAB — I-STAT BETA HCG BLOOD, ED (MC, WL, AP ONLY): I-stat hCG, quantitative: <5 m[IU]/mL (ref <5)

## 2020-02-27 LAB — LIPASE, BLOOD: Lipase: 19 U/L (ref 11–51)

## 2020-02-27 MED ORDER — LIDOCAINE VISCOUS HCL 2 % MT SOLN
15.0000 mL | Freq: Once | OROMUCOSAL | Status: AC
  Administered 2020-02-27: 15 mL via ORAL
  Filled 2020-02-27: qty 15

## 2020-02-27 MED ORDER — SUCRALFATE 1 G PO TABS
1.0000 g | ORAL_TABLET | Freq: Once | ORAL | Status: AC
  Administered 2020-02-27: 1 g via ORAL
  Filled 2020-02-27: qty 1

## 2020-02-27 MED ORDER — SUCRALFATE 1 G PO TABS
1.0000 g | ORAL_TABLET | Freq: Three times a day (TID) | ORAL | 0 refills | Status: DC
Start: 2020-02-27 — End: 2020-05-17

## 2020-02-27 MED ORDER — ALUM & MAG HYDROXIDE-SIMETH 200-200-20 MG/5ML PO SUSP
30.0000 mL | Freq: Once | ORAL | Status: AC
  Administered 2020-02-27: 30 mL via ORAL
  Filled 2020-02-27: qty 30

## 2020-02-27 MED ORDER — DICYCLOMINE HCL 10 MG/5ML PO SOLN
10.0000 mg | Freq: Once | ORAL | Status: AC
  Administered 2020-02-27: 10 mg via ORAL
  Filled 2020-02-27: qty 5

## 2020-02-27 MED ORDER — SODIUM CHLORIDE 0.9% FLUSH: 3.0000 mL | Freq: Once | INTRAVENOUS | Status: DC

## 2020-02-27 NOTE — Discharge Instructions (Addendum)
I discussed your symptoms with your gastroenterologist who recommends close follow-up in their office.  Please call Monday morning to make an appointment with them.  I also provided you with a referral for general surgeon.  Please call their office Monday morning as well in order to set up an appointment for evaluation.  I suspect that your symptoms will be dramatically improved with removal of the gallbladder.  If you are a good surgical candidate they may consider removal of the gallbladder.  Please take the medication I have provided you for treatment of your reflux.  Also take Tylenol 1000 mg every 6 hours for pain.

## 2020-02-27 NOTE — ED Triage Notes (Signed)
Pt here for another eval of generalized upper body pain x 1 year, but pt reports L sided abdominal pain worse over the last two days. Endorses feeling generally unwell and is unable to eat or sleep.

## 2020-02-27 NOTE — ED Provider Notes (Signed)
[facility] EMERGENCY DEPARTMENT Provider Note   CSN: [id number] Arrival date & time: 02/27/20  1725     History Chief Complaint  Patient presents with  . Generalized Body Aches  . Abdominal Pain    Heidi Keller is a 34 y.o. female.  HPI Patient is a 34 year old female with past medical history of anxiety and cholelithiasis and gastritis as well as esophageal reflux.  No surgical history apart from C-section.  Patient states that she comes in today for evaluation of abdominal pain that is ongoing for approximately 6 months.  She states that the pain is severe, worse with eating, with no associated nausea, vomiting or diarrhea. She states that she has been seen by gastroenterologist and told that she had gastritis.  She has been told to monitor her symptoms and follow-up however she states that today she had some left-sided abdominal pain and when she bent over to tie her shoes this morning she states she felt a little lightheaded.  She did not pass out or feel that she was about to pass out she denies any chest pain or shortness of breath but states that she does sometimes feel like she has a heaviness in her chest.  She feels like she has been generally fatigued.  She has noticed some gradually yellowing of her palms.  She denies any headache, fevers, chills or vomiting.  She states that she has decreased appetite because she knows it will cause pain.  She states she has been eating primarily oatmeal.  She has been able to drink plenty of water, juice and milk.    Past Medical History:  Diagnosis Date  . Anxiety   . Gallstones     Patient Active Problem List   Diagnosis Date Noted  . GAD (generalized anxiety disorder) 02/03/2019  . Depression, major, single episode, severe ([place]) 02/03/2019  . Gastroesophageal reflux disease without esophagitis 02/03/2019    Past Surgical History:  Procedure Laterality Date  . CESAREAN SECTION  2017     OB History     No obstetric history on file.     Family History  Problem Relation Age of Onset  . Diabetes Maternal Grandmother   . Hypertension Maternal Grandmother   . Sickle cell anemia Daughter 1  . Colon cancer Neg Hx   . Esophageal cancer Neg Hx   . Rectal cancer Neg Hx   . Stomach cancer Neg Hx     Social History   Tobacco Use  . Smoking status: Never Smoker  . Smokeless tobacco: Never Used  Vaping Use  . Vaping Use: Never used  Substance Use Topics  . Alcohol use: Never  . Drug use: Never    Home Medications Prior to Admission medications   Medication Sig Start Date End Date Taking? Authorizing Provider  Cholecalciferol (VITAMIN D3) 1.25 MG (50000 UT) TABS Take 50,000 Units by mouth once a week.   Yes [provider]  omeprazole (PRILOSEC) 40 MG capsule Take 1 capsule (40 mg total) by mouth daily. For 3 months 01/30/20  Yes Nandigam, Venia Minks, MD  ondansetron (ZOFRAN ODT) 4 MG disintegrating tablet Take 1 tablet (4 mg total) by mouth every 8 (eight) hours as needed for nausea. Patient not taking: Reported on 02/27/2020 02/17/20   Larene Pickett, PA-C  sucralfate (CARAFATE) 1 g tablet Take 1 tablet (1 g total) by mouth 4 (four) times daily -  with meals and at bedtime. 02/27/20   Tedd Sias, PA  Allergies    Patient has no known allergies.  Review of Systems   Review of Systems  Constitutional: Positive for appetite change and fatigue. Negative for chills and fever.  HENT: Negative for congestion.   Eyes: Negative for pain.  Respiratory: Negative for cough and shortness of breath.   Cardiovascular: Negative for chest pain and leg swelling.  Gastrointestinal: Positive for abdominal pain. Negative for abdominal distention, diarrhea, nausea and vomiting.  Genitourinary: Negative for dysuria.  Musculoskeletal: Negative for myalgias.  Skin: Negative for rash.  Neurological: Negative for dizziness and headaches.    Physical Exam Updated Vital Signs BP (!)  116/105   Pulse 66   Temp 98.7 F (37.1 C) (Oral)   Resp 13   SpO2 100%   Physical Exam Vitals and nursing note reviewed.  Constitutional:      General: She is not in acute distress.    Comments: Patient is well-appearing 34 year old female in no acute distress sitting in bed.  Pleasant, able answer questions appropriately and follows commands.  HENT:     Head: Normocephalic and atraumatic.     Nose: Nose normal.  Eyes:     General: No scleral icterus. Cardiovascular:     Rate and Rhythm: Normal rate and regular rhythm.     Pulses: Normal pulses.     Heart sounds: Normal heart sounds.  Pulmonary:     Effort: Pulmonary effort is normal. No respiratory distress.     Breath sounds: No wheezing.  Abdominal:     Palpations: Abdomen is soft.     Tenderness: There is generalized abdominal tenderness.     Comments: Patient has labs nonprotuberant, soft abdomen.  There is some mild generalized tenderness to deep palpation however negative McBurney, Murphy, no guarding or rebound.  Negative Rovsing and psoas.  No CVA tenderness.  Genitourinary:    Comments: Deferred Musculoskeletal:     Cervical back: Normal range of motion.     Right lower leg: No edema.     Left lower leg: No edema.  Skin:    General: Skin is warm and dry.     Capillary Refill: Capillary refill takes less than 2 seconds.  Neurological:     Mental Status: She is alert. Mental status is at baseline.  Psychiatric:        Mood and Affect: Mood normal.        Behavior: Behavior normal.     ED Results / Procedures / Treatments   Labs (all labs ordered are listed, but only abnormal results are displayed) Labs Reviewed  COMPREHENSIVE METABOLIC PANEL - Abnormal; Notable for the following components:      Result Value   Glucose, Bld 113 (*)    BUN <5 (*)    Calcium 10.4 (*)    Alkaline Phosphatase 33 (*)    Total Bilirubin 2.4 (*)    All other components within normal limits  URINALYSIS, ROUTINE W REFLEX  MICROSCOPIC - Abnormal; Notable for the following components:   Color, Urine STRAW (*)    Specific Gravity, Urine 1.003 (*)    pH 9.0 (*)    All other components within normal limits  LIPASE, BLOOD  CBC  I-STAT BETA HCG BLOOD, ED ([facility], [facility], [facility] ONLY)    EKG None  Radiology No results found.  Procedures Procedures (including critical care time)  Medications Ordered in ED Medications  sodium chloride flush (NS) 0.9 % injection 3 mL (has no administration in time range)  alum & mag hydroxide-simeth (  MAALOX/MYLANTA) 200-200-20 MG/5ML suspension 30 mL (30 mLs Oral Given 02/27/20 2327)    And  lidocaine (XYLOCAINE) 2 % viscous mouth solution 15 mL (15 mLs Oral Given 02/27/20 2327)  dicyclomine (BENTYL) 10 MG/5ML solution 10 mg (10 mg Oral Given 02/27/20 2344)  sucralfate (CARAFATE) tablet 1 g (1 g Oral Given 02/27/20 2327)    ED Course  I have reviewed the triage vital signs and the nursing notes.  Pertinent labs & imaging results that were available during my care of the patient were reviewed by me and considered in my medical decision making (see chart for details).    MDM Rules/Calculators/A&P                          Patient 34 year old female with past medical history of cholelithiasis anxiety depression presented today with generalized abdominal pain.  She states is been ongoing and prolonged for her for approximately 6 months.  She had an ultrasound done presently 10 days ago which I reviewed.  Appears the patient has cholelithiasis without evidence of cholecystitis.  On my examination she has generalized abdominal tenderness with no negative Murphy sign.  She does have some palmar jaundice.  She does not have any scleral icterus.  Her abdomen is nondistended is soft and not significantly tender.  She is well-appearing and has vital signs within normal limits.  She does have very mildly elevated blood pressure which is likely secondary to pain discomfort long ER visit.  Patient has  urinalysis without evidence of infection.  I-STAT hCG is negative.  CMP with no significant elect light abnormalities but patient does have elevated total bilirubin which has increased slightly from her last gastroenterology visit 10 days ago from 2.2-2.4 today.  CBC is without leukocytosis or anemia.  Lipase within normal limits doubt pancreatitis.  The causes of generalized abdominal pain include but are not limited to AAA, mesenteric ischemia, appendicitis, diverticulitis, DKA, gastritis, gastroenteritis, AMI, nephrolithiasis, pancreatitis, peritonitis, adrenal insufficiency,lead poisoning, iron toxicity, intestinal ischemia, constipation, UTI,SBO/LBO, splenic rupture, biliary disease, IBD, IBS, PUD, or hepatitis. Ectopic pregnancy, ovarian torsion, PID.  I specifically doubt ectopic pregnancy given the patient has no lower abdominal pain, has negative hCG, has no vaginal bleeding or discharge.  I doubt ovarian torsion for similar reasons that she has no lower abdominal pain.  Doubt pancreatitis, appendicitis.  I specifically doubt choledocholithiasis, cholecystitis or cholangitis given her lack of leukocytosis, Murphy sign, severe pain, vital sign abnormalities or nausea or vomiting.  I did give patient strict return precautions with considering her mild jaundice.  Suspect that there may be some contributing symptoms from patient's chronic gastritis which she has been diagnosed with by her gastroenterologist.  I will provide patient with Carafate to use at home.  She already has Zofran and has had no episodes of nausea or vomiting.  She has general surgery and gastroenterology follow-up information.  She will return to her established gastroenterologist for reevaluation.  Discussed with Dr. Russella Dar of [facility] gastroenterology who recommended follow-up appointment with his practice.  Also recommended follow-up with general surgery.   The medical records were personally reviewed by myself. I personally  reviewed all lab results and interpreted all imaging studies and either concurred with their official read or contacted radiology for clarification. Additional history obtained from old records and consultation with her gastroenterologist office.  This patient appears reasonably screened and I doubt any other medical condition requiring further workup, evaluation, or treatment in the ED  at this time prior to discharge.   Patient's vitals are WNL apart from vital sign abnormalities discussed above, patient is in NAD, and able to ambulate in the ED at their baseline and able to tolerate PO.  Pain has been managed or a plan has been made for home management and has no complaints prior to discharge. Patient is comfortable with above plan and for discharge at this time. All questions were answered prior to disposition. Results from the ER workup discussed with the patient face to face and all questions answered to the best of my ability. The patient is safe for discharge with strict return precautions. Patient appears safe for discharge with appropriate follow-up. Conveyed my impression with the patient and they voiced understanding and are agreeable to plan.   An After Visit Summary was printed and given to the patient.  Portions of this note were generated with Scientist, clinical (histocompatibility and immunogenetics). Dictation errors may occur despite best attempts at proofreading.    Final Clinical Impression(s) / ED Diagnoses Final diagnoses:  Generalized abdominal pain    Rx / DC Orders ED Discharge Orders         Ordered    sucralfate (CARAFATE) 1 g tablet  3 times daily with meals & bedtime     Discontinue  Reprint     02/27/20 2320    Ambulatory referral to General Surgery     Discontinue  Reprint     02/27/20 2357           Gailen Shelter, PA 02/28/20 0002    Gwyneth Sprout, MD 02/28/20 1531

## 2020-02-28 NOTE — ED Notes (Signed)
Patient verbalizes understanding of discharge instructions. Opportunity for questioning and answers were provided. Armband removed by staff, pt discharged from ED. Pt. ambulatory and discharged home.  

## 2020-03-01 ENCOUNTER — Telehealth: Payer: Self-pay | Admitting: Gastroenterology

## 2020-03-01 NOTE — Telephone Encounter (Signed)
Pt requested a call back.  I did not understand what pt said.

## 2020-03-02 NOTE — Telephone Encounter (Signed)
Called the patient. No answer.  She was seen in the ED on 02/27/20. See that visit for details. She was told to follow up with GI, Surgery and a copy was sent to her PCP. I have scheduled her an appointment with Mike Gip, [facility] 03/08/20 at 3:00pm and have left a message asking she call us back.

## 2020-03-03 NOTE — Telephone Encounter (Signed)
Patient has viewed the appointment on her My Chart

## 2020-03-08 ENCOUNTER — Telehealth: Payer: Self-pay

## 2020-03-08 ENCOUNTER — Encounter: Payer: Self-pay | Admitting: Physician Assistant

## 2020-03-08 ENCOUNTER — Other Ambulatory Visit (INDEPENDENT_AMBULATORY_CARE_PROVIDER_SITE_OTHER): Payer: BC Managed Care – PPO

## 2020-03-08 ENCOUNTER — Other Ambulatory Visit: Payer: Self-pay

## 2020-03-08 ENCOUNTER — Ambulatory Visit (INDEPENDENT_AMBULATORY_CARE_PROVIDER_SITE_OTHER): Payer: BC Managed Care – PPO | Admitting: Physician Assistant

## 2020-03-08 VITALS — BP 124/74 | HR 96 | Ht 64.0 in | Wt 144.2 lb

## 2020-03-08 DIAGNOSIS — R17 Unspecified jaundice: Secondary | ICD-10-CM | POA: Diagnosis not present

## 2020-03-08 DIAGNOSIS — R1013 Epigastric pain: Secondary | ICD-10-CM | POA: Diagnosis not present

## 2020-03-08 DIAGNOSIS — K8010 Calculus of gallbladder with chronic cholecystitis without obstruction: Secondary | ICD-10-CM | POA: Diagnosis not present

## 2020-03-08 DIAGNOSIS — K219 Gastro-esophageal reflux disease without esophagitis: Secondary | ICD-10-CM

## 2020-03-08 LAB — COMPREHENSIVE METABOLIC PANEL
ALT: 12 U/L (ref 0–35)
AST: 13 U/L (ref 0–37)
Albumin: 5 g/dL (ref 3.5–5.2)
Alkaline Phosphatase: 32 U/L — ABNORMAL LOW (ref 39–117)
BUN: 4 mg/dL — ABNORMAL LOW (ref 6–23)
CO2: 25 mEq/L (ref 19–32)
Calcium: 10.4 mg/dL (ref 8.4–10.5)
Chloride: 103 mEq/L (ref 96–112)
Creatinine, Ser: 0.62 mg/dL (ref 0.40–1.20)
GFR: 109.67 mL/min (ref >60.00)
Glucose, Bld: 83 mg/dL (ref 70–99)
Potassium: 3.7 mEq/L (ref 3.5–5.1)
Sodium: 137 mEq/L (ref 135–145)
Total Bilirubin: 2.1 mg/dL — ABNORMAL HIGH (ref 0.2–1.2)
Total Protein: 8 g/dL (ref 6.0–8.3)

## 2020-03-08 LAB — CBC WITH DIFFERENTIAL/PLATELET
Basophils Absolute: 0 10*3/uL (ref 0.0–0.1)
Basophils Relative: 0.8 % (ref 0.0–3.0)
Eosinophils Absolute: 0 10*3/uL (ref 0.0–0.7)
Eosinophils Relative: 0.8 % (ref 0.0–5.0)
HCT: 36.6 % (ref 36.0–46.0)
Hemoglobin: 12.3 g/dL (ref 12.0–15.0)
Lymphocytes Relative: 34.2 % (ref 12.0–46.0)
Lymphs Abs: 1.2 10*3/uL (ref 0.7–4.0)
MCHC: 33.7 g/dL (ref 30.0–36.0)
MCV: 91.5 fl (ref 78.0–100.0)
Monocytes Absolute: 0.3 10*3/uL (ref 0.1–1.0)
Monocytes Relative: 9.6 % (ref 3.0–12.0)
Neutro Abs: 2 10*3/uL (ref 1.4–7.7)
Neutrophils Relative %: 54.6 % (ref 43.0–77.0)
Platelets: 164 10*3/uL (ref 150.0–400.0)
RBC: 4.01 Mil/uL (ref 3.87–5.11)
RDW: 13.3 % (ref 11.5–15.5)
WBC: 3.6 10*3/uL — ABNORMAL LOW (ref 4.0–10.5)

## 2020-03-08 MED ORDER — GLYCOPYRROLATE 2 MG PO TABS
2.0000 mg | ORAL_TABLET | Freq: Two times a day (BID) | ORAL | 1 refills | Status: DC
Start: 2020-03-08 — End: 2020-03-23

## 2020-03-08 MED ORDER — PANTOPRAZOLE SODIUM 40 MG PO TBEC
40.0000 mg | DELAYED_RELEASE_TABLET | Freq: Every morning | ORAL | 1 refills | Status: DC
Start: 2020-03-08 — End: 2020-03-08

## 2020-03-08 MED ORDER — POLYETHYLENE GLYCOL 3350 17 GM/SCOOP PO POWD
17.0000 g | Freq: Two times a day (BID) | ORAL | Status: DC
Start: 2020-03-08 — End: 2020-03-08

## 2020-03-08 NOTE — Progress Notes (Signed)
Called and informed pt that Amy would like for her to take Glycopyrrolate (Robinol) 2 mg twice a day for abdominal pain. Not Miralax.

## 2020-03-08 NOTE — Progress Notes (Signed)
Subjective:    Patient ID: Heidi Keller, female    DOB: 1985-12-21, 34 y.o.   MRN: [id number]  HPI Heidi Keller is a 34 year old African female from [place], recently established with GI and seen by myself on 01/14/2020.  At that time she was complaining of chest and epigastric pain for the previous 7 to 8 months. She underwent EGD May 2021 which showed grade B esophagitis, no stricture, and patchy gastritis.  Biopsies showed no evidence of H. pylori, mild reactive gastropathy and biopsies from the esophagus consistent with reflux induced changes.  She has been on omeprazole 40 mg p.o. daily and it sounds as if some of her chest symptoms have improved.  She continues to have episodes of what she describes as severe epigastric pain.  She had 2 ER visits in May 2021.  1 of these was for severe epigastric pain.  The second visit was for more generalized complaints of abdominal pain. Abdominal ultrasound on 02/17/2020 showed layering gallstones, CBD of 2.3 mm, no gallbladder wall thickening.  T bili noted to be elevated at 2.4 LFTs were within normal limits UA negative and CBC within normal limits.  UA was negative beta hCG negative She also had CT scan of the abdomen and pelvis on 01/19/2020 which showed subtle gallstones and was an otherwise negative exam. Chest x-ray was - 02/16/2020  Today it sounds as if she has not had any recent episodes of the significant epigastric pain but still has milder symptoms intermittently.  She says her appetite is fair, she does feel that most of her symptoms are worse postprandially.  She has not had any vomiting, no fever but feels as if she has had some chills off and on.  Bowel movements have been normal but she notes stools have been very dark to black.  No use of Pepto-Bismol etc.  She is also complaining of a more generalized left sided abdominal discomfort that radiates up into her chest.  She says she thinks this is a "referred" pain.  She also mentioned at the  beginning of the visit today that she felt she was jaundiced.  Review of Systems Pertinent positive and negative review of systems were noted in the above HPI section.  All other review of systems was otherwise negative.  Outpatient Encounter Medications as of 03/08/2020  Medication Sig  . Cholecalciferol (VITAMIN D3) 1.25 MG (50000 UT) TABS Take 50,000 Units by mouth once a week.  Marland Kitchen omeprazole (PRILOSEC) 40 MG capsule Take 1 capsule (40 mg total) by mouth daily. For 3 months  . ondansetron (ZOFRAN ODT) 4 MG disintegrating tablet Take 1 tablet (4 mg total) by mouth every 8 (eight) hours as needed for nausea. (Patient not taking: Reported on 02/27/2020)  . polyethylene glycol powder (GLYCOLAX/MIRALAX) 17 GM/SCOOP powder Take 17 g by mouth in the morning and at bedtime.  . sucralfate (CARAFATE) 1 g tablet Take 1 tablet (1 g total) by mouth 4 (four) times daily -  with meals and at bedtime. (Patient not taking: Reported on 03/08/2020)  . [DISCONTINUED] pantoprazole (PROTONIX) 40 MG tablet Take 1 tablet (40 mg total) by mouth in the morning.   No facility-administered encounter medications on file as of 03/08/2020.   No Known Allergies Patient Active Problem List   Diagnosis Date Noted  . GAD (generalized anxiety disorder) 02/03/2019  . Depression, major, single episode, severe ([place]) 02/03/2019  . Gastroesophageal reflux disease without esophagitis 02/03/2019   Social History   Socioeconomic History  . Marital  status: Married    Spouse name: Not on file  . Number of children: 1  . Years of education: Not on file  . Highest education level: Not on file  Occupational History  . Occupation: Housewife  Tobacco Use  . Smoking status: Never Smoker  . Smokeless tobacco: Never Used  Vaping Use  . Vaping Use: Never used  Substance and Sexual Activity  . Alcohol use: Never  . Drug use: Never  . Sexual activity: Not Currently  Other Topics Concern  . Not on file  Social History Narrative  .  Not on file   Social Determinants of Health   Financial Resource Strain:   . Difficulty of Paying Living Expenses:   Food Insecurity:   . Worried About Charity fundraiser in the Last Year:   . Arboriculturist in the Last Year:   Transportation Needs:   . Film/video editor (Medical):   Marland Kitchen Lack of Transportation (Non-Medical):   Physical Activity:   . Days of Exercise per Week:   . Minutes of Exercise per Session:   Stress:   . Feeling of Stress :   Social Connections:   . Frequency of Communication with Friends and Family:   . Frequency of Social Gatherings with Friends and Family:   . Attends Religious Services:   . Active Member of Clubs or Organizations:   . Attends Archivist Meetings:   Marland Kitchen Marital Status:   Intimate Partner Violence:   . Fear of Current or Ex-Partner:   . Emotionally Abused:   Marland Kitchen Physically Abused:   . Sexually Abused:     Heidi Keller family history includes Diabetes in her maternal grandmother; Hypertension in her maternal grandmother; Sickle cell anemia (age of onset: 51) in her daughter.      Objective:    Vitals:   03/08/20 1520  BP: 124/74  Pulse: 96    Physical Exam Well-developed well-nourished African female in no acute distress.  Height, Weight 144, BMI 24.7  HEENT; nontraumatic normocephalic, EOMI, PE RR LA, sclera anicteric. Oropharynx;not examined Neck; supple, no JVD Cardiovascular; regular rate and rhythm with S1-S2, no murmur rub or gallop Pulmonary; Clear bilaterally Abdomen; soft, nontender, nondistended, no palpable mass or hepatosplenomegaly, bowel sounds are active, no costal margin tenderness Rectal; small external hemorrhoid, stool dark brown and heme-negative Skin; benign exam, no jaundice rash or appreciable lesions Extremities; no clubbing cyanosis or edema skin warm and dry Neuro/Psych; alert and oriented x4, grossly nonfocal mood and affect appropriate       Assessment & Plan:   #65 34 year old  African female with intermittent episodes of epigastric pain, recent ultrasound and CT scan both showing cholelithiasis without gallbladder wall thickening and normal CBD.  I think her episodic pain is likely secondary to biliary colic Most recent labs showed elevated T bili of 2.4, otherwise normal LFTs  #2  GERD with esophagitis/grade B on recent EGD.  On omeprazole 40 mg p.o. daily  #3.  Generalized left-sided abdominal discomfort, etiology not clear, nontender on exam and recent negative CT  #4 complaint of dark stool-heme-negative on exam today  Plan; continue omeprazole 40 mg p.o. every morning Add trial of glycopyrrolate 2 mg p.o. twice daily for nonspecific left-sided abdominal discomfort. Will schedule patient a consultation with [facility] to discuss lap chole. CBC with differential, c-Met, and fractionated bilirubin today.     Wilmon Conover Genia Harold PA-C 03/08/2020   Cc: Flossie Buffy, NP

## 2020-03-08 NOTE — Telephone Encounter (Signed)
Referral faxed to [facility] for lap Cholecystectomy.

## 2020-03-08 NOTE — Patient Instructions (Addendum)
If you are age 35 or older, your body mass index should be between 23-30. Your Body mass index is 24.76 kg/m. If this is out of the aforementioned range listed, please consider follow up with your Primary Care Provider.  If you are age 57 or younger, your body mass index should be between 19-25. Your Body mass index is 24.76 kg/m. If this is out of the aformentioned range listed, please consider follow up with your Primary Care Provider.   Please go to the lab in the basement of our building to have lab work done as you leave today. Hit "B" for basement when you get on the elevator.  When the doors open the lab is on your left.  We will call you with the results. Thank you.   Please purchase the following medications over the counter and take as directed: Miralax: Take twice a day for abdominal pain  Continue Prilosec (omeprazole) 40 mg: Take daily in the morning  We will refer you to [facility] for laparoscopic cholecystectomy. They will call you to schedule an appointment. Their number is [phone].  Please let us know if you have not heard from them within a week or two and we will follow up.  Thank you for entrusting me with your care and for choosing [facility], Amy Oswald Hillock, P.A.- C.  Due to recent changes in healthcare laws, you may see the results of your imaging and laboratory studies on MyChart before your provider has had a chance to review them.  We understand that in some cases there may be results that are confusing or concerning to you. Not all laboratory results come back in the same time frame and the provider may be waiting for multiple results in order to interpret others.  Please give [place] 48 hours in order for your provider to thoroughly review all the results before contacting the office for clarification of your results.

## 2020-03-09 LAB — BILIRUBIN, FRACTIONATED(TOT/DIR/INDIR)
Bilirubin, Direct: 0.4 mg/dL — ABNORMAL HIGH (ref 0.0–0.2)
Indirect Bilirubin: 1.5 mg/dL (calc) — ABNORMAL HIGH (ref 0.2–1.2)
Total Bilirubin: 1.9 mg/dL — ABNORMAL HIGH (ref 0.2–1.2)

## 2020-03-12 NOTE — Telephone Encounter (Signed)
LM for [facility] to call back re: referral from [facility]. Does pt have an appt?

## 2020-03-15 NOTE — Telephone Encounter (Signed)
Called [facility].  Patient has an appt on 7-19- at 10:30am with Dr. Alvan Dame.

## 2020-03-18 NOTE — Progress Notes (Signed)
Reviewed and agree with documentation and assessment and plan. K. Veena Celester Morgan , MD   

## 2020-03-22 ENCOUNTER — Other Ambulatory Visit: Payer: Self-pay | Admitting: Physician Assistant

## 2020-03-24 ENCOUNTER — Other Ambulatory Visit: Payer: Self-pay | Admitting: Obstetrics and Gynecology

## 2020-03-24 DIAGNOSIS — N644 Mastodynia: Secondary | ICD-10-CM

## 2020-04-07 ENCOUNTER — Ambulatory Visit
Admission: RE | Admit: 2020-04-07 | Discharge: 2020-04-07 | Disposition: A | Discharge status: Home / Self Care | Payer: BC Managed Care – PPO | Source: Ambulatory Visit | Attending: Obstetrics and Gynecology | Admitting: Obstetrics and Gynecology

## 2020-04-07 ENCOUNTER — Other Ambulatory Visit: Payer: Self-pay

## 2020-04-07 DIAGNOSIS — N644 Mastodynia: Secondary | ICD-10-CM

## 2020-04-22 ENCOUNTER — Telehealth: Payer: Self-pay | Admitting: Hematology

## 2020-04-22 NOTE — Telephone Encounter (Signed)
Received a new hem referral from Dr. Chales Salmon for sickle cell trait. Heidi Keller returned my call and has been scheduled to see Dr. Candise Che on 9/1 at 1pm. Pt aware to arrive 15 minutes early.

## 2020-05-05 ENCOUNTER — Ambulatory Visit: Payer: 59 | Admitting: Gastroenterology

## 2020-05-12 ENCOUNTER — Encounter (HOSPITAL_COMMUNITY): Payer: Self-pay | Admitting: Emergency Medicine

## 2020-05-12 ENCOUNTER — Emergency Department (HOSPITAL_COMMUNITY): Payer: BC Managed Care – PPO

## 2020-05-12 ENCOUNTER — Other Ambulatory Visit: Payer: Self-pay

## 2020-05-12 ENCOUNTER — Emergency Department (HOSPITAL_COMMUNITY)
Admission: EM | Admit: 2020-05-12 | Discharge: 2020-05-13 | Disposition: A | Discharge status: Home / Self Care | Payer: BC Managed Care – PPO | Attending: Emergency Medicine | Admitting: Emergency Medicine

## 2020-05-12 DIAGNOSIS — R079 Chest pain, unspecified: Secondary | ICD-10-CM | POA: Insufficient documentation

## 2020-05-12 DIAGNOSIS — Z5321 Procedure and treatment not carried out due to patient leaving prior to being seen by health care provider: Secondary | ICD-10-CM | POA: Insufficient documentation

## 2020-05-12 DIAGNOSIS — R55 Syncope and collapse: Secondary | ICD-10-CM | POA: Insufficient documentation

## 2020-05-12 LAB — CBC
HCT: 36.4 % (ref 36.0–46.0)
Hemoglobin: 12.1 g/dL (ref 12.0–15.0)
MCH: 29.9 pg (ref 26.0–34.0)
MCHC: 33.2 g/dL (ref 30.0–36.0)
MCV: 89.9 fL (ref 80.0–100.0)
Platelets: 167 10*3/uL (ref 150–400)
RBC: 4.05 MIL/uL (ref 3.87–5.11)
RDW: 12.8 % (ref 11.5–15.5)
WBC: 3.5 10*3/uL — ABNORMAL LOW (ref 4.0–10.5)
nRBC: 0 % (ref 0.0–0.2)

## 2020-05-12 LAB — URINALYSIS, ROUTINE W REFLEX MICROSCOPIC
Bacteria, UA: NONE SEEN
Bilirubin Urine: NEGATIVE
Glucose, UA: NEGATIVE mg/dL
Hgb urine dipstick: NEGATIVE
Ketones, ur: 20 mg/dL — AB
Leukocytes,Ua: NEGATIVE
Nitrite: NEGATIVE
Protein, ur: NEGATIVE mg/dL
Specific Gravity, Urine: 1.017 (ref 1.005–1.030)
pH: 5 (ref 5.0–8.0)

## 2020-05-12 LAB — COMPREHENSIVE METABOLIC PANEL
ALT: 11 U/L (ref 0–44)
AST: 16 U/L (ref 15–41)
Albumin: 4.6 g/dL (ref 3.5–5.0)
Alkaline Phosphatase: 35 U/L — ABNORMAL LOW (ref 38–126)
Anion gap: 12 (ref 5–15)
BUN: <5 mg/dL — ABNORMAL LOW (ref 6–20)
CO2: 23 mmol/L (ref 22–32)
Calcium: 9.8 mg/dL (ref 8.9–10.3)
Chloride: 104 mmol/L (ref 98–111)
Creatinine, Ser: 0.72 mg/dL (ref 0.44–1.00)
GFR calc Af Amer: >60 mL/min (ref >60)
GFR calc non Af Amer: >60 mL/min (ref >60)
Glucose, Bld: 81 mg/dL (ref 70–99)
Potassium: 3.4 mmol/L — ABNORMAL LOW (ref 3.5–5.1)
Sodium: 139 mmol/L (ref 135–145)
Total Bilirubin: 2.4 mg/dL — ABNORMAL HIGH (ref 0.3–1.2)
Total Protein: 7.6 g/dL (ref 6.5–8.1)

## 2020-05-12 LAB — LIPASE, BLOOD: Lipase: 21 U/L (ref 11–51)

## 2020-05-12 LAB — I-STAT BETA HCG BLOOD, ED (MC, WL, AP ONLY): I-stat hCG, quantitative: <5 m[IU]/mL (ref <5)

## 2020-05-12 LAB — TROPONIN I (HIGH SENSITIVITY)
Troponin I (High Sensitivity): 3 ng/L (ref <18)
Troponin I (High Sensitivity): 4 ng/L (ref <18)

## 2020-05-12 NOTE — ED Triage Notes (Addendum)
Onset for a while ongoing acid reflux and worsening overtime with chest pain and near syncope today. Alert answering and following commands appropriate.

## 2020-05-17 ENCOUNTER — Emergency Department (HOSPITAL_COMMUNITY): Payer: BC Managed Care – PPO

## 2020-05-17 ENCOUNTER — Encounter (HOSPITAL_COMMUNITY): Payer: Self-pay | Admitting: Emergency Medicine

## 2020-05-17 ENCOUNTER — Other Ambulatory Visit: Payer: Self-pay

## 2020-05-17 ENCOUNTER — Emergency Department (HOSPITAL_COMMUNITY)
Admission: EM | Admit: 2020-05-17 | Discharge: 2020-05-17 | Disposition: A | Discharge status: Home / Self Care | Payer: BC Managed Care – PPO | Attending: Emergency Medicine | Admitting: Emergency Medicine

## 2020-05-17 DIAGNOSIS — Z79899 Other long term (current) drug therapy: Secondary | ICD-10-CM | POA: Insufficient documentation

## 2020-05-17 DIAGNOSIS — R1013 Epigastric pain: Secondary | ICD-10-CM

## 2020-05-17 DIAGNOSIS — K8071 Calculus of gallbladder and bile duct without cholecystitis with obstruction: Secondary | ICD-10-CM | POA: Diagnosis not present

## 2020-05-17 DIAGNOSIS — K8021 Calculus of gallbladder without cholecystitis with obstruction: Secondary | ICD-10-CM

## 2020-05-17 LAB — LIPASE, BLOOD: Lipase: 26 U/L (ref 11–51)

## 2020-05-17 LAB — I-STAT BETA HCG BLOOD, ED (MC, WL, AP ONLY): I-stat hCG, quantitative: <5 m[IU]/mL (ref <5)

## 2020-05-17 LAB — COMPREHENSIVE METABOLIC PANEL
ALT: 14 U/L (ref 0–44)
AST: 18 U/L (ref 15–41)
Albumin: 4.7 g/dL (ref 3.5–5.0)
Alkaline Phosphatase: 35 U/L — ABNORMAL LOW (ref 38–126)
Anion gap: 13 (ref 5–15)
BUN: <5 mg/dL — ABNORMAL LOW (ref 6–20)
CO2: 22 mmol/L (ref 22–32)
Calcium: 9.5 mg/dL (ref 8.9–10.3)
Chloride: 98 mmol/L (ref 98–111)
Creatinine, Ser: 0.81 mg/dL (ref 0.44–1.00)
GFR calc Af Amer: >60 mL/min (ref >60)
GFR calc non Af Amer: >60 mL/min (ref >60)
Glucose, Bld: 126 mg/dL — ABNORMAL HIGH (ref 70–99)
Potassium: 3.5 mmol/L (ref 3.5–5.1)
Sodium: 133 mmol/L — ABNORMAL LOW (ref 135–145)
Total Bilirubin: 4.4 mg/dL — ABNORMAL HIGH (ref 0.3–1.2)
Total Protein: 7.7 g/dL (ref 6.5–8.1)

## 2020-05-17 LAB — CBC
HCT: 36.2 % (ref 36.0–46.0)
Hemoglobin: 12.4 g/dL (ref 12.0–15.0)
MCH: 30.5 pg (ref 26.0–34.0)
MCHC: 34.3 g/dL (ref 30.0–36.0)
MCV: 89.2 fL (ref 80.0–100.0)
Platelets: 176 10*3/uL (ref 150–400)
RBC: 4.06 MIL/uL (ref 3.87–5.11)
RDW: 12.6 % (ref 11.5–15.5)
WBC: 3.4 10*3/uL — ABNORMAL LOW (ref 4.0–10.5)
nRBC: 0 % (ref 0.0–0.2)

## 2020-05-17 MED ORDER — ALUM & MAG HYDROXIDE-SIMETH 200-200-20 MG/5ML PO SUSP
30.0000 mL | Freq: Once | ORAL | Status: AC
  Administered 2020-05-17: 30 mL via ORAL
  Filled 2020-05-17: qty 30

## 2020-05-17 MED ORDER — LACTATED RINGERS IV BOLUS
1000.0000 mL | Freq: Once | INTRAVENOUS | Status: AC
  Administered 2020-05-17: 1000 mL via INTRAVENOUS

## 2020-05-17 MED ORDER — OMEPRAZOLE 40 MG PO CPDR
40.0000 mg | DELAYED_RELEASE_CAPSULE | Freq: Every day | ORAL | 1 refills | Status: DC
Start: 2020-05-17 — End: 2020-05-19

## 2020-05-17 MED ORDER — PANTOPRAZOLE SODIUM 40 MG IV SOLR
40.0000 mg | Freq: Once | INTRAVENOUS | Status: AC
  Administered 2020-05-17: 40 mg via INTRAVENOUS
  Filled 2020-05-17: qty 40

## 2020-05-17 MED ORDER — SUCRALFATE 1 G PO TABS: 1.0000 g | ORAL_TABLET | Freq: Three times a day (TID) | ORAL | 0 refills | Status: DC

## 2020-05-17 MED ORDER — LIDOCAINE VISCOUS HCL 2 % MT SOLN
15.0000 mL | Freq: Once | OROMUCOSAL | Status: AC
  Administered 2020-05-17: 15 mL via ORAL
  Filled 2020-05-17: qty 15

## 2020-05-17 MED ORDER — ONDANSETRON 4 MG PO TBDP: 4.0000 mg | ORAL_TABLET | Freq: Three times a day (TID) | ORAL | 0 refills | Status: DC | PRN

## 2020-05-17 MED ORDER — METOCLOPRAMIDE HCL 5 MG/ML IJ SOLN
10.0000 mg | Freq: Once | INTRAMUSCULAR | Status: AC
  Administered 2020-05-17: 10 mg via INTRAVENOUS
  Filled 2020-05-17: qty 2

## 2020-05-17 NOTE — ED Provider Notes (Addendum)
[facility] Provider Note   CSN: [id number] Arrival date & time: 05/17/20  1345     History Chief Complaint  Patient presents with  . Gastroesophageal Reflux    Heidi Keller is a 34 y.o. female past medical significant for GERD, gallstones, anxiety presented to the ED today for epigastric abdominal pain, nausea and decreased p.o.  Patient states that this is a recurrent problem for her for which she has been referred to surgery as well as gastroenterology before, is on a PPI at this time.  She states that symptoms come and "flares" characterized by sharp pain that radiates to her back and her left shoulder particularly.  She states that the symptoms are worsened with eating to the point where she has tolerated very little in the last few days.  Denies fevers.  Denies change in bowel movements, denies vomiting.  The history is provided by the patient.  Illness Quality:  Abdominal pain Severity:  Severe Onset quality:  Gradual Duration:  1 week Timing:  Constant Progression:  Waxing and waning Chronicity:  Recurrent Associated symptoms: abdominal pain and nausea   Associated symptoms: no chest pain, no cough, no fever, no headaches, no rash, no shortness of breath and no vomiting        Past Medical History:  Diagnosis Date  . Anxiety   . Gallstones     Patient Active Problem List   Diagnosis Date Noted  . GAD (generalized anxiety disorder) 02/03/2019  . Depression, major, single episode, severe (HCC) 02/03/2019  . Gastroesophageal reflux disease without esophagitis 02/03/2019    Past Surgical History:  Procedure Laterality Date  . CESAREAN SECTION  2017     OB History   No obstetric history on file.     Family History  Problem Relation Age of Onset  . Diabetes Maternal Grandmother   . Hypertension Maternal Grandmother   . Sickle cell anemia Daughter 1  . Colon cancer Neg Hx   . Esophageal cancer Neg Hx   .  Rectal cancer Neg Hx   . Stomach cancer Neg Hx     Social History   Tobacco Use  . Smoking status: Never Smoker  . Smokeless tobacco: Never Used  Vaping Use  . Vaping Use: Never used  Substance Use Topics  . Alcohol use: Never  . Drug use: Never    Home Medications Prior to Admission medications   Medication Sig Start Date End Date Taking? Authorizing Provider  Cholecalciferol (VITAMIN D3) 1.25 MG (50000 UT) TABS Take 50,000 Units by mouth once a week.    [provider]  glycopyrrolate (ROBINUL) 2 MG tablet TAKE 1 TABLET (2 MG TOTAL) BY MOUTH 2 (TWO) TIMES DAILY. 03/23/20   Esterwood, Amy S, PA-C  omeprazole (PRILOSEC) 40 MG capsule Take 1 capsule (40 mg total) by mouth daily. For 3 months 05/17/20   Loree Feeedding, Ariele Vidrio, MD  ondansetron (ZOFRAN ODT) 4 MG disintegrating tablet Take 1 tablet (4 mg total) by mouth every 8 (eight) hours as needed for nausea. 05/17/20   Loree Feeedding, Noga Fogg, MD  sucralfate (CARAFATE) 1 g tablet Take 1 tablet (1 g total) by mouth 4 (four) times daily -  with meals and at bedtime. 05/17/20   Loree Feeedding, Sheranda Seabrooks, MD    Allergies    Patient has no known allergies.  Review of Systems   Review of Systems  Constitutional: Positive for appetite change. Negative for chills and fever.  HENT: Negative for facial swelling and voice  change.   Eyes: Negative for redness and visual disturbance.  Respiratory: Negative for cough and shortness of breath.   Cardiovascular: Negative for chest pain and palpitations.  Gastrointestinal: Positive for abdominal pain and nausea. Negative for vomiting.  Genitourinary: Negative for difficulty urinating and dysuria.  Musculoskeletal: Negative for gait problem and joint swelling.  Skin: Negative for rash and wound.  Neurological: Negative for dizziness and headaches.  Psychiatric/Behavioral: Negative for confusion and suicidal ideas.    Physical Exam Updated Vital Signs BP 122/70 (BP Location: Right Arm)   Pulse 75   Temp  97.7 F (36.5 C) (Oral)   Resp 16   LMP 04/25/2020 (Approximate)   SpO2 100%   Physical Exam Constitutional:      Appearance: She is normal weight. She is ill-appearing.  HENT:     Head: Normocephalic and atraumatic.     Mouth/Throat:     Mouth: Mucous membranes are moist.     Pharynx: Oropharynx is clear.  Eyes:     General: No scleral icterus.    Pupils: Pupils are equal, round, and reactive to light.  Cardiovascular:     Rate and Rhythm: Normal rate and regular rhythm.     Pulses: Normal pulses.  Pulmonary:     Effort: Pulmonary effort is normal. No respiratory distress.  Abdominal:     General: There is no distension.     Tenderness: There is abdominal tenderness. There is no guarding or rebound.     Comments: Negative Murphy sign  Musculoskeletal:        General: No tenderness or deformity.     Cervical back: Normal range of motion and neck supple.  Neurological:     General: No focal deficit present.     Mental Status: She is alert and oriented to person, place, and time.  Psychiatric:        Mood and Affect: Mood normal.        Behavior: Behavior normal.     ED Results / Procedures / Treatments   Labs (all labs ordered are listed, but only abnormal results are displayed) Labs Reviewed  COMPREHENSIVE METABOLIC PANEL - Abnormal; Notable for the following components:      Result Value   Sodium 133 (*)    Glucose, Bld 126 (*)    BUN <5 (*)    Alkaline Phosphatase 35 (*)    Total Bilirubin 4.4 (*)    All other components within normal limits  CBC - Abnormal; Notable for the following components:   WBC 3.4 (*)    All other components within normal limits  LIPASE, BLOOD  URINALYSIS, ROUTINE W REFLEX MICROSCOPIC  I-STAT BETA HCG BLOOD, ED ([facility], [facility], [facility] ONLY)    EKG None  Radiology US Abdomen Limited  Result Date: 05/17/2020 CLINICAL DATA:  Right upper quadrant pain EXAM: ULTRASOUND ABDOMEN LIMITED RIGHT UPPER QUADRANT COMPARISON:  Ultrasound 02/17/2020, CT  01/19/2020 FINDINGS: Gallbladder: Multiple mobile, echogenic and shadowing gallstones, largest measuring up to 7 mm in diameter. No gallbladder wall thickening or pericholecystic fluid. Sonographic Eulah Pont sign is reportedly negative. Common bile duct: Diameter: 3 mm, within normal limits. Liver: No focal lesion identified. Within normal limits in parenchymal echogenicity. Portal vein is patent on color Doppler imaging with normal direction of blood flow towards the liver. Other: None. IMPRESSION: Cholelithiasis without evidence of acute cholecystitis or biliary ductal dilatation. Electronically Signed   By: Kreg Shropshire M.D.   On: 05/17/2020 17:25   DG Chest Portable 1 View  Result Date: 05/17/2020 CLINICAL DATA:  Chest pain, nausea EXAM: PORTABLE CHEST 1 VIEW COMPARISON:  05/12/2020 FINDINGS: The heart size and mediastinal contours are within normal limits. Both lungs are clear. The visualized skeletal structures are unremarkable. IMPRESSION: No active disease. Electronically Signed   By: Helyn Numbers MD   On: 05/17/2020 15:57    Procedures Procedures (including critical care time)  Medications Ordered in ED Medications  alum & mag hydroxide-simeth (MAALOX/MYLANTA) 200-200-20 MG/5ML suspension 30 mL (30 mLs Oral Given 05/17/20 1604)    And  lidocaine (XYLOCAINE) 2 % viscous mouth solution 15 mL (15 mLs Oral Given 05/17/20 1604)  pantoprazole (PROTONIX) injection 40 mg (40 mg Intravenous Given 05/17/20 1600)  lactated ringers bolus 1,000 mL (0 mLs Intravenous Stopped 05/17/20 1825)  metoCLOPramide (REGLAN) injection 10 mg (10 mg Intravenous Given 05/17/20 1600)    ED Course  I have reviewed the triage vital signs and the nursing notes.  Pertinent labs & imaging results that were available during my care of the patient were reviewed by me and considered in my medical decision making (see chart for details).    MDM Rules/Calculators/A&P                          Differential diagnosis  considered: Atypical ACS, biliary pathology, GERD, gastritis, peptic ulcer, emergent complications of peptic ulcer, gastroparesis  Patient presenting for recurrent epigastric abdominal pain that is sharp radiating to her back and shoulder, previously has been diagnosed with GERD for the symptoms and is also been diagnosed with gallstones with no specific plan for operative intervention report of the patient.  Her exam is significant for tenderness mostly in the epigastrium with a negative Murphy sign and no fevers, have a lower suspicion for cholecystitis at this time although cholelithiasis remains the differential.  Low suspicion for perforated gastric ulcer given that she is nontoxic-appearing and afebrile with relatively reassuring abdominal exam.  Higher suspicion for gastritis/peptic ulcer disease  Limiter GI cocktail, upper quadrant ultrasound and upright chest x-ray the low suspicion again for perforation.  Labs from triage reviewed and gross unremarkable, no significant anemia or transaminitis or elevation in lipase.  Notably the patient presented a week ago for these symptoms, does have some narrow Q waves in inferior leads although suspect that this is likely baseline the patient had negative troponins x2 on this presentation, very low suspicion for ACS.  Right upper quadrant ultrasound showing cholelithiasis with no evidence of cholecystitis and have a low suspicion for cholecystitis on my exam, on review of the patient's most recent GI visit, they feel the symptoms are more reflective of this cholelithiasis.  After GI cocktail and Protonix, patient symptomatically improved, counseled that she will need to follow closely with general surgery as she would likely benefit from cholecystectomy she expressed understanding and agreement.  Prilosec, Zofran, sulcal fate refilled  Final Clinical Impression(s) / ED Diagnoses Final diagnoses:  Epigastric pain  Calculus of gallbladder with  biliary obstruction but without cholecystitis    Rx / DC Orders ED Discharge Orders         Ordered    omeprazole (PRILOSEC) 40 MG capsule  Daily        05/17/20 1820    ondansetron (ZOFRAN ODT) 4 MG disintegrating tablet  Every 8 hours PRN        05/17/20 1820    sucralfate (CARAFATE) 1 g tablet  3 times daily with meals & bedtime  05/17/20 1841         Labs, studies and imaging reviewed by myself and considered in medical decision making if ordered. Imaging interpreted by radiology. Pt was discussed with my attending, Dr. Jeraldine Loots  Electronically signed by:  Daryel Kenneth Redding8/30/20217:12 PM       Loree Fee, MD 05/17/20 Cristine Polio, MD 05/17/20 Darnell Level    Gerhard Munch, MD 05/18/20 [phone]

## 2020-05-17 NOTE — ED Triage Notes (Signed)
C/o gastric reflux, was here recently for same but did not stay - says she is unable to eat, having pain, nausea.

## 2020-05-17 NOTE — ED Notes (Signed)
Patient transported to Ultrasound 

## 2020-05-19 ENCOUNTER — Other Ambulatory Visit: Payer: Self-pay

## 2020-05-19 ENCOUNTER — Inpatient Hospital Stay: Payer: BC Managed Care – PPO | Attending: Hematology | Admitting: Hematology

## 2020-05-19 ENCOUNTER — Inpatient Hospital Stay: Payer: BC Managed Care – PPO

## 2020-05-19 VITALS — BP 128/83 | HR 65 | Temp 97.1°F | Resp 18 | Ht 64.0 in | Wt 125.4 lb

## 2020-05-19 DIAGNOSIS — R42 Dizziness and giddiness: Secondary | ICD-10-CM | POA: Insufficient documentation

## 2020-05-19 DIAGNOSIS — R109 Unspecified abdominal pain: Secondary | ICD-10-CM | POA: Diagnosis not present

## 2020-05-19 DIAGNOSIS — F419 Anxiety disorder, unspecified: Secondary | ICD-10-CM | POA: Diagnosis not present

## 2020-05-19 DIAGNOSIS — D573 Sickle-cell trait: Secondary | ICD-10-CM

## 2020-05-19 DIAGNOSIS — R634 Abnormal weight loss: Secondary | ICD-10-CM

## 2020-05-19 DIAGNOSIS — D582 Other hemoglobinopathies: Secondary | ICD-10-CM

## 2020-05-19 LAB — RETICULOCYTES
Immature Retic Fract: 6.8 % (ref 2.3–15.9)
RBC.: 4.02 MIL/uL (ref 3.87–5.11)
Retic Count, Absolute: 31.8 10*3/uL (ref 19.0–186.0)
Retic Ct Pct: 0.8 % (ref 0.4–3.1)

## 2020-05-19 LAB — IRON AND TIBC
Iron: 86 ug/dL (ref 41–142)
Saturation Ratios: 30 % (ref 21–57)
TIBC: 293 ug/dL (ref 236–444)
UIBC: 206 ug/dL (ref 120–384)

## 2020-05-19 LAB — CMP (CANCER CENTER ONLY)
ALT: 10 U/L (ref 0–44)
AST: 18 U/L (ref 15–41)
Albumin: 4.7 g/dL (ref 3.5–5.0)
Alkaline Phosphatase: 39 U/L (ref 38–126)
Anion gap: 11 (ref 5–15)
BUN: <4 mg/dL — ABNORMAL LOW (ref 6–20)
CO2: 26 mmol/L (ref 22–32)
Calcium: 10.7 mg/dL — ABNORMAL HIGH (ref 8.9–10.3)
Chloride: 102 mmol/L (ref 98–111)
Creatinine: 0.73 mg/dL (ref 0.44–1.00)
GFR, Est AFR Am: >60 mL/min (ref >60)
GFR, Estimated: >60 mL/min (ref >60)
Glucose, Bld: 95 mg/dL (ref 70–99)
Potassium: 3.3 mmol/L — ABNORMAL LOW (ref 3.5–5.1)
Sodium: 139 mmol/L (ref 135–145)
Total Bilirubin: 3.6 mg/dL (ref 0.3–1.2)
Total Protein: 7.9 g/dL (ref 6.5–8.1)

## 2020-05-19 LAB — CBC WITH DIFFERENTIAL/PLATELET
Abs Immature Granulocytes: 0 10*3/uL (ref 0.00–0.07)
Basophils Absolute: 0 10*3/uL (ref 0.0–0.1)
Basophils Relative: 1 %
Eosinophils Absolute: 0 10*3/uL (ref 0.0–0.5)
Eosinophils Relative: 1 %
HCT: 34.7 % — ABNORMAL LOW (ref 36.0–46.0)
Hemoglobin: 12.2 g/dL (ref 12.0–15.0)
Immature Granulocytes: 0 %
Lymphocytes Relative: 48 %
Lymphs Abs: 1 10*3/uL (ref 0.7–4.0)
MCH: 30.5 pg (ref 26.0–34.0)
MCHC: 35.2 g/dL (ref 30.0–36.0)
MCV: 86.8 fL (ref 80.0–100.0)
Monocytes Absolute: 0.2 10*3/uL (ref 0.1–1.0)
Monocytes Relative: 9 %
Neutro Abs: 0.9 10*3/uL — ABNORMAL LOW (ref 1.7–7.7)
Neutrophils Relative %: 41 %
Platelets: 167 10*3/uL (ref 150–400)
RBC: 4 MIL/uL (ref 3.87–5.11)
RDW: 12.4 % (ref 11.5–15.5)
WBC: 2.1 10*3/uL — ABNORMAL LOW (ref 4.0–10.5)
nRBC: 0 % (ref 0.0–0.2)

## 2020-05-19 LAB — LACTATE DEHYDROGENASE: LDH: 165 U/L (ref 98–192)

## 2020-05-19 LAB — VITAMIN B12: Vitamin B-12: 1829 pg/mL — ABNORMAL HIGH (ref 180–914)

## 2020-05-19 LAB — FERRITIN: Ferritin: 258 ng/mL (ref 11–307)

## 2020-05-19 MED ORDER — MAGIC MOUTHWASH W/LIDOCAINE: 5.0000 mL | Freq: Four times a day (QID) | ORAL | 1 refills | Status: DC | PRN

## 2020-05-19 MED ORDER — OMEPRAZOLE 40 MG PO CPDR
40.0000 mg | DELAYED_RELEASE_CAPSULE | Freq: Two times a day (BID) | ORAL | 0 refills | Status: DC
Start: 2020-05-19 — End: 2020-06-10

## 2020-05-19 NOTE — Progress Notes (Signed)
HEMATOLOGY/ONCOLOGY CONSULTATION NOTE  Date of Service: 05/19/2020  Patient Care Team: Lorenda Ishihara, MD as PCP - General (Internal Medicine)  CHIEF COMPLAINTS/PURPOSE OF CONSULTATION:  Sickle Cell Trait  HISTORY OF PRESENTING ILLNESS:   Heidi Keller is a wonderful 34 y.o. female who has been referred to [place] by Dr. Chales Salmon for evaluation and management of sickle cell trait. The pt reports that she is doing well overall.    The pt reports that she has been experiencing dizziness, abdominal pain, and cervical lymphadenopathy.   Pt has a history of gallstones, gastritis, and esophagitis. Pt has been seen by Gastroenterology and Surgery. The surgeon did not feel that a cholecystectomy would improve her discomfort. She was given an Upper Endoscopy in May with Dr. Lavon Paganini. That is when her gastritis and esophagitis were discovered. She was placed on Omeprazole, which has improved her symptoms overall. She is currently taking Omeprazole only once per day. Pt still has flares after eating certain foods, which have resulted in ED visits in the past. She has lost 40 lbs over the last four months due inability to eat caused by the pain. 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 She has an upcoming appointment with her GI in a week.    Pt has had dizziness and lightheadeness that began about four years ago, just after her daughter was born. Her menstrual flow is heavier than it was prior to the birth of her daughter and her menstrual cycles lasts three days. Pt denies previous ear pain or infections. Pt has had low Iron Saturation levels on previous labs with PCP.   She first noticed an enlarged lymph node in her right cervical area in May of 2020. Pt has had this clinically evaluated by her PCP, who did not believe that it was, a lymph node. Pt has noticed a reduction in the size of the lump as she lost weight. Previously  pt noticed an increase in the size of her tonsils and feels that the lump grew at the time as well. She endorses dental issues.   Her daughter has sickle cell anemia. She was diagnosed at 11 months after experiencing anemia and swollen fingers. Pt and her husband were then tested and found to be positive for sickle cell trait. Pt has had stress and anxiety concerning her daughter's diagnosis and treatment. She denies kidney problems, vision problems, pain crisis, or needing any blood transfusions. Pt is of [place] descent.   Most recent lab results (05/17/2020) of CBC & CMP is as follows: all values are WNL except for WBC at 3.4K, Sodium at 133, Glucose at 126, BUN at <5, ALP at 35, Total Bilirubin at 4.4.  On review of systems, pt reports lightheadedness, dizziness, abdominal pain, abdominal cramping, stress, anxiety, weight loss and denies fevers, night sweats, bloody/black stools and any other symptoms.   On PMHx the pt reports C-section, Anxiety, Gallstones, Gastritis, Esophagitis.  MEDICAL HISTORY:  Past Medical History:  Diagnosis Date  . Anxiety   . Gallstones     SURGICAL HISTORY: Past Surgical History:  Procedure Laterality Date  . CESAREAN SECTION  2017    SOCIAL HISTORY: Social History   Socioeconomic History  . Marital status: Married    Spouse name: Not on file  . Number of children: 1  . Years of education: Not on file  . Highest education level: Not on file  Occupational History  . Occupation: Housewife  Tobacco Use  .  Smoking status: Never Smoker  . Smokeless tobacco: Never Used  Vaping Use  . Vaping Use: Never used  Substance and Sexual Activity  . Alcohol use: Never  . Drug use: Never  . Sexual activity: Not Currently  Other Topics Concern  . Not on file  Social History Narrative  . Not on file   Social Determinants of Health   Financial Resource Strain:   . Difficulty of Paying Living Expenses: Not on file  Food Insecurity:   . Worried About  Programme researcher, broadcasting/film/video in the Last Year: Not on file  . Ran Out of Food in the Last Year: Not on file  Transportation Needs:   . Lack of Transportation (Medical): Not on file  . Lack of Transportation (Non-Medical): Not on file  Physical Activity:   . Days of Exercise per Week: Not on file  . Minutes of Exercise per Session: Not on file  Stress:   . Feeling of Stress : Not on file  Social Connections:   . Frequency of Communication with Friends and Family: Not on file  . Frequency of Social Gatherings with Friends and Family: Not on file  . Attends Religious Services: Not on file  . Active Member of Clubs or Organizations: Not on file  . Attends Banker Meetings: Not on file  . Marital Status: Not on file  Intimate Partner Violence:   . Fear of Current or Ex-Partner: Not on file  . Emotionally Abused: Not on file  . Physically Abused: Not on file  . Sexually Abused: Not on file    FAMILY HISTORY: Family History  Problem Relation Age of Onset  . Diabetes Maternal Grandmother   . Hypertension Maternal Grandmother   . Sickle cell anemia Daughter 1  . Colon cancer Neg Hx   . Esophageal cancer Neg Hx   . Rectal cancer Neg Hx   . Stomach cancer Neg Hx     ALLERGIES:  has No Known Allergies.  MEDICATIONS:  Current Outpatient Medications  Medication Sig Dispense Refill  . Cholecalciferol (VITAMIN D3) 1.25 MG (50000 UT) TABS Take 50,000 Units by mouth once a week.    Marland Kitchen glycopyrrolate (ROBINUL) 2 MG tablet TAKE 1 TABLET (2 MG TOTAL) BY MOUTH 2 (TWO) TIMES DAILY. 180 tablet 0  . magic mouthwash w/lidocaine SOLN Take 5 mLs by mouth 4 (four) times daily as needed (throat and esophageal pain). 80 ml viscous lidocaine 2% 80 ml Mylanta 80 ml diphenhydramine 12.5 mg per 5 ml elixir 80 ml nystatin 100,000U suspension 80 ml distilled water Disp: 400 ml 400 mL 1  . omeprazole (PRILOSEC) 40 MG capsule Take 1 capsule (40 mg total) by mouth 2 (two) times daily at 8 am and 10 pm.  For 3 months 60 capsule 0  . ondansetron (ZOFRAN ODT) 4 MG disintegrating tablet Take 1 tablet (4 mg total) by mouth every 8 (eight) hours as needed for nausea. 20 tablet 0  . sucralfate (CARAFATE) 1 g tablet Take 1 tablet (1 g total) by mouth 4 (four) times daily -  with meals and at bedtime. 20 tablet 0   No current facility-administered medications for this visit.    REVIEW OF SYSTEMS:    10 Point review of Systems was done is negative except as noted above.  PHYSICAL EXAMINATION: ECOG PERFORMANCE STATUS: 1 - Symptomatic but completely ambulatory  . Vitals:   05/19/20 1311  BP: 128/83  Pulse: 65  Resp: 18  Temp: (!) 97.1 F (  36.2 C)  SpO2: 100%   Filed Weights   05/19/20 1311  Weight: 125 lb 6.4 oz (56.9 kg)   .Body mass index is 21.52 kg/m.  GENERAL:alert, in no acute distress and comfortable SKIN: no acute rashes, no significant lesions EYES: conjunctiva are pink and non-injected, sclera anicteric OROPHARYNX: MMM, no exudates, no oropharyngeal erythema or ulceration NECK: supple, no JVD LYMPH:  no palpable lymphadenopathy in the cervical, axillary or inguinal regions LUNGS: clear to auscultation b/l with normal respiratory effort HEART: regular rate & rhythm ABDOMEN:  normoactive bowel sounds , non tender, not distended. Extremity: no pedal edema PSYCH: alert & oriented x 3 with fluent speech NEURO: no focal motor/sensory deficits  LABORATORY DATA:  I have reviewed the data as listed  . CBC Latest Ref Rng & Units 05/19/2020 05/17/2020 05/12/2020  WBC 4.0 - 10.5 K/uL 2.1(L) 3.4(L) 3.5(L)  Hemoglobin 12.0 - 15.0 g/dL 16.112.2 09.612.4 04.512.1  Hematocrit 36 - 46 % 34.7(L) 36.2 36.4  Platelets 150 - 400 K/uL 167 176 167    . CMP Latest Ref Rng & Units 05/19/2020 05/17/2020 05/12/2020  Glucose 70 - 99 mg/dL 95 409(W126(H) 81  BUN 6 - 20 mg/dL <1(X<4(L) <9(J<5(L) <4(N<5(L)  Creatinine 0.44 - 1.00 mg/dL 8.290.73 5.620.81 1.300.72  Sodium 135 - 145 mmol/L 139 133(L) 139  Potassium 3.5 - 5.1 mmol/L 3.3(L)  3.5 3.4(L)  Chloride 98 - 111 mmol/L 102 98 104  CO2 22 - 32 mmol/L 26 22 23   Calcium 8.9 - 10.3 mg/dL 10.7(H) 9.5 9.8  Total Protein 6.5 - 8.1 g/dL 7.9 7.7 7.6  Total Bilirubin 0.3 - 1.2 mg/dL 8.6(VH3.6(HH) 4.4(H) 2.4(H)  Alkaline Phos 38 - 126 U/L 39 35(L) 35(L)  AST 15 - 41 U/L 18 18 16   ALT 0 - 44 U/L 10 14 11      RADIOGRAPHIC STUDIES: I have personally reviewed the radiological images as listed and agreed with the findings in the report. DG Chest 2 View  Result Date: 05/12/2020 CLINICAL DATA:  Chest pain and shortness of breath for 2 days. EXAM: CHEST - 2 VIEW COMPARISON:  02/16/2020 FINDINGS: The cardiomediastinal contours are normal. The lungs are clear. Pulmonary vasculature is normal. No consolidation, pleural effusion, or pneumothorax. No acute osseous abnormalities are seen. IMPRESSION: Negative radiographs of the chest. Electronically Signed   By: Narda RutherfordMelanie  Sanford M.D.   On: 05/12/2020 15:09   US Abdomen Limited  Result Date: 05/17/2020 CLINICAL DATA:  Right upper quadrant pain EXAM: ULTRASOUND ABDOMEN LIMITED RIGHT UPPER QUADRANT COMPARISON:  Ultrasound 02/17/2020, CT 01/19/2020 FINDINGS: Gallbladder: Multiple mobile, echogenic and shadowing gallstones, largest measuring up to 7 mm in diameter. No gallbladder wall thickening or pericholecystic fluid. Sonographic Eulah PontMurphy sign is reportedly negative. Common bile duct: Diameter: 3 mm, within normal limits. Liver: No focal lesion identified. Within normal limits in parenchymal echogenicity. Portal vein is patent on color Doppler imaging with normal direction of blood flow towards the liver. Other: None. IMPRESSION: Cholelithiasis without evidence of acute cholecystitis or biliary ductal dilatation. Electronically Signed   By: Kreg ShropshirePrice  DeHay M.D.   On: 05/17/2020 17:25   DG Chest Portable 1 View  Result Date: 05/17/2020 CLINICAL DATA:  Chest pain, nausea EXAM: PORTABLE CHEST 1 VIEW COMPARISON:  05/12/2020 FINDINGS: The heart size and  mediastinal contours are within normal limits. Both lungs are clear. The visualized skeletal structures are unremarkable. IMPRESSION: No active disease. Electronically Signed   By: Helyn NumbersAshesh  Parikh MD   On: 05/17/2020 15:57    ASSESSMENT &  PLAN:   34 yo with   1) reported h/o sickle cell trait -- not confirm with labs 2) Abdominal pain -- mx by GI thought to be related to gastritis and esophagitis and ?cholelithiasis. 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 -Advised pt that dizziness/lightheadeness is not from anemia. It is more likely from poor nutrition and hydration recently. -Advised pt that sickle cell trait is typically asymptomatic as long as individuals do not become hypoxemic or significantly dehydrated -Advised pt that individuals with sickle cell trait should get regular eye exams yearly, as it increases the risk of retinopathy.  -Advised pt that sickle cell trait increases the risk of blood clots and hematuria.  -Recommend pt stay up-to-date with appropriate vaccinations with PCP.  -Advised pt that if her lymphadenopathy was pathologic it would have increased in size over the last year and a half.   -Advised pt that dental or tonsillar infections could lead to some mildly enlarged lymph nodes in the area.  -Advised pt that it's okay to take 40 mg Omeprazole up to twice per day. -Recommend pt avoid caffeinated beverages, spicy foods, and drinking too much water with main meals. -Recommend pt f/u with GI as scheduled -Recommend pt f/u with PCP for referral to a counselor for depression and significant stress surrounding her childs medical problems with sickle cell anemia. -Rx Magic mouthwash -Will get labs today - need to confirm sickle cell trait -Will see back in 2-3 weeks via phone   FOLLOW UP: Labs  today Phone visit with Dr Candise Che in 2-3 weeks  All of the patients questions were answered with apparent satisfaction. The patient knows to call the clinic with any problems, questions or concerns.  I spent 35 mins counseling the patient face to face. The total time spent in the appointment was 45 minutes and more than 50% was on counseling and direct patient cares.    Wyvonnia Lora MD MS AAHIVMS [facility] Hematology/Oncology Physician [facility]  (Office):       [phone] (Work cell):  [phone] (Fax):           [phone]  05/19/2020 11:53 PM  I, Carollee Herter, am acting as a scribe for Dr. Wyvonnia Lora.   .I have reviewed the above documentation for accuracy and completeness, and I agree with the above. Johney Maine MD

## 2020-05-20 LAB — FOLATE RBC
Folate, Hemolysate: 376 ng/mL
Folate, RBC: 1005 ng/mL (ref >498)
Hematocrit: 37.4 % (ref 34.0–46.6)

## 2020-05-20 LAB — HAPTOGLOBIN: Haptoglobin: 30 mg/dL — ABNORMAL LOW (ref 33–278)

## 2020-05-21 LAB — HGB FRACTIONATION CASCADE
Hgb A2: 3.4 % — ABNORMAL HIGH (ref 1.8–3.2)
Hgb A: 55.3 % — ABNORMAL LOW (ref 96.4–98.8)
Hgb F: 0 % (ref 0.0–2.0)
Hgb S: 41.3 % — ABNORMAL HIGH

## 2020-05-21 LAB — HGB SOLUBILITY: Hgb Solubility: POSITIVE — AB

## 2020-05-26 ENCOUNTER — Other Ambulatory Visit: Payer: Self-pay | Admitting: Gastroenterology

## 2020-05-26 DIAGNOSIS — K2100 Gastro-esophageal reflux disease with esophagitis, without bleeding: Secondary | ICD-10-CM

## 2020-05-31 ENCOUNTER — Ambulatory Visit
Admission: RE | Admit: 2020-05-31 | Discharge: 2020-05-31 | Disposition: A | Discharge status: Home / Self Care | Payer: BC Managed Care – PPO | Source: Ambulatory Visit | Attending: Gastroenterology | Admitting: Gastroenterology

## 2020-05-31 DIAGNOSIS — K2100 Gastro-esophageal reflux disease with esophagitis, without bleeding: Secondary | ICD-10-CM

## 2020-05-31 LAB — ALPHA-THALASSEMIA GENOTYPR

## 2020-06-09 ENCOUNTER — Inpatient Hospital Stay (HOSPITAL_BASED_OUTPATIENT_CLINIC_OR_DEPARTMENT_OTHER): Payer: BC Managed Care – PPO | Admitting: Hematology

## 2020-06-09 DIAGNOSIS — D573 Sickle-cell trait: Secondary | ICD-10-CM | POA: Diagnosis not present

## 2020-06-09 NOTE — Progress Notes (Signed)
HEMATOLOGY/ONCOLOGY CONSULTATION NOTE  Date of Service: 06/09/2020  Patient Care Team: Lorenda IshiharaVaradarajan, Rupashree, MD as PCP - General (Internal Medicine)  CHIEF COMPLAINTS/PURPOSE OF CONSULTATION:  Sickle Cell Trait  HISTORY OF PRESENTING ILLNESS:   Heidi Keller is a wonderful 34 y.o. female who has been referred to us by Dr. Chales SalmonVaradarajan for evaluation and management of sickle cell trait. The pt reports that she is doing well overall.    The pt reports that she has been experiencing dizziness, abdominal pain, and cervical lymphadenopathy.   Pt has a history of gallstones, gastritis, and esophagitis. Pt has been seen by Gastroenterology and Surgery. The surgeon did not feel that a cholecystectomy would improve her discomfort. She was given an Upper Endoscopy in May with Dr. Lavon PaganiniNandigam. That is when her gastritis and esophagitis were discovered. She was placed on Omeprazole, which has improved her symptoms overall. She is currently taking Omeprazole only once per day. Pt still has flares after eating certain foods, which have resulted in ED visits in the past. She has lost 40 lbs over the last four months due inability to eat caused by the pain. 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 She has an upcoming appointment with her GI in a week.    Pt has had dizziness and lightheadeness that began about four years ago, just after her daughter was born. Her menstrual flow is heavier than it was prior to the birth of her daughter and her menstrual cycles lasts three days. Pt denies previous ear pain or infections. Pt has had low Iron Saturation levels on previous labs with PCP.   She first noticed an enlarged lymph node in her right cervical area in May of 2020. Pt has had this clinically evaluated by her PCP, who did not believe that it was, a lymph node. Pt has noticed a reduction in the size of the lump as she lost weight. Previously  pt noticed an increase in the size of her tonsils and feels that the lump grew at the time as well. She endorses dental issues.   Her daughter has sickle cell anemia. She was diagnosed at 11 months after experiencing anemia and swollen fingers. Pt and her husband were then tested and found to be positive for sickle cell trait. Pt has had stress and anxiety concerning her daughter's diagnosis and treatment. She denies kidney problems, vision problems, pain crisis, or needing any blood transfusions. Pt is of [place] descent.   Most recent lab results (05/17/2020) of CBC & CMP is as follows: all values are WNL except for WBC at 3.4K, Sodium at 133, Glucose at 126, BUN at <5, ALP at 35, Total Bilirubin at 4.4.  On review of systems, pt reports lightheadedness, dizziness, abdominal pain, abdominal cramping, stress, anxiety, weight loss and denies fevers, night sweats, bloody/black stools and any other symptoms.   On PMHx the pt reports C-section, Anxiety, Gallstones, Gastritis, Esophagitis.  INTERVAL HISTORY: I connected with  Heidi Keller on 06/09/20 by telephone and verified that I am speaking with the correct person using two identifiers.   I discussed the limitations of evaluation and management by telemedicine. The patient expressed understanding and agreed to proceed.  Other persons participating in the visit and their role in the encounter:       -Carollee HerterJazzmine Knight, Medical Scribe  Patient's location: Home Provider's location: [facility]  Heidi Keller is a wonderful 34 y.o. female who is here  for evaluation and management of sickle cell trait. The patient's last visit with [place] was on 05/19/2020. The pt reports that she is doing well overall.  Dr. Marca Ancona, Gastroenterologist, had the pt complete a Barium Swallow study and referred pt to another surgeon. The surgeon plans on completing a cholecystectomy in November. Dr. Marca Ancona switched pt from Omeprazole to Dexilant.  The pt reports that she has noticed some relief from her abdominal pain. Pt also endorses an improved mental and emotional status. She has been set up to speak with a counselor.   Of note since the patient's last visit, pt has had Barium Swallow Study ([id number]) completed on 05/31/2020 with results revealing "No hiatal hernia or reflux identified. Normal exam."  Lab results (05/19/20) of CBC w/diff and CMP is as follows: all values are WNL except for WBC at 2.1K, HCT at 34.7, Neutro Abs at 0.9, Potassium at 3.3, BUN at <4, Calcium at 10.7, Total Bilirubin at 3.6. 05/19/2020 LDH at 165 05/19/2020 Ferritin at 258 05/19/2020 Haptoglobin at 30 05/19/2020 Vitamin B12 at 1829 05/19/2020 Alpha-Thalassemia is "Negative" 05/19/2020 Folate Hemolysate at 376.0, Folate RBC at 1005 05/19/2020 Retic Ct Pct at 0.8, Retic Ct As at 31.8, Immature Retic Fract at 6.8 05/19/2020 Iron Panel is as follows: Iron at 86, TIBC at 293, Sat Ratios at 30, UIBC at 206 05/19/2020 Hgb Fractionation Cascade is as follows: Hgb F at 0.0, Hgb A at 55.3, Hgb A2 at 3.4, Hgb at 41.3 05/19/2020 Hgb Solubility is "Positive - Hemoglobin pattern and concentrations are consistent with sickle cell trait (heterozygous)."  On review of systems, pt reports improved abdominal pain, improved mental/emotional well being and denies any other symptoms.    MEDICAL HISTORY:  Past Medical History:  Diagnosis Date  . Anxiety   . Gallstones     SURGICAL HISTORY: Past Surgical History:  Procedure Laterality Date  . CESAREAN SECTION  2017    SOCIAL HISTORY: Social History   Socioeconomic History  . Marital status: Married    Spouse name: Not on file  . Number of children: 1  . Years of education: Not on file  . Highest education level: Not on file  Occupational History  . Occupation: Housewife  Tobacco Use  . Smoking status: Never Smoker  . Smokeless tobacco: Never Used  Vaping Use  . Vaping Use: Never used  Substance and  Sexual Activity  . Alcohol use: Never  . Drug use: Never  . Sexual activity: Not Currently  Other Topics Concern  . Not on file  Social History Narrative  . Not on file   Social Determinants of Health   Financial Resource Strain:   . Difficulty of Paying Living Expenses: Not on file  Food Insecurity:   . Worried About Programme researcher, broadcasting/film/video in the Last Year: Not on file  . Ran Out of Food in the Last Year: Not on file  Transportation Needs:   . Lack of Transportation (Medical): Not on file  . Lack of Transportation (Non-Medical): Not on file  Physical Activity:   . Days of Exercise per Week: Not on file  . Minutes of Exercise per Session: Not on file  Stress:   . Feeling of Stress : Not on file  Social Connections:   . Frequency of Communication with Friends and Family: Not on file  . Frequency of Social Gatherings with Friends and Family: Not on file  . Attends Religious Services: Not on file  . Active Member of Clubs or Organizations:  Not on file  . Attends Banker Meetings: Not on file  . Marital Status: Not on file  Intimate Partner Violence:   . Fear of Current or Ex-Partner: Not on file  . Emotionally Abused: Not on file  . Physically Abused: Not on file  . Sexually Abused: Not on file    FAMILY HISTORY: Family History  Problem Relation Age of Onset  . Diabetes Maternal Grandmother   . Hypertension Maternal Grandmother   . Sickle cell anemia Daughter 1  . Colon cancer Neg Hx   . Esophageal cancer Neg Hx   . Rectal cancer Neg Hx   . Stomach cancer Neg Hx     ALLERGIES:  has No Known Allergies.  MEDICATIONS:  Current Outpatient Medications  Medication Sig Dispense Refill  . Cholecalciferol (VITAMIN D3) 1.25 MG (50000 UT) TABS Take 50,000 Units by mouth once a week.    Marland Kitchen glycopyrrolate (ROBINUL) 2 MG tablet TAKE 1 TABLET (2 MG TOTAL) BY MOUTH 2 (TWO) TIMES DAILY. 180 tablet 0  . magic mouthwash w/lidocaine SOLN Take 5 mLs by mouth 4 (four) times  daily as needed (throat and esophageal pain). 80 ml viscous lidocaine 2% 80 ml Mylanta 80 ml diphenhydramine 12.5 mg per 5 ml elixir 80 ml nystatin 100,000U suspension 80 ml distilled water Disp: 400 ml 400 mL 1  . omeprazole (PRILOSEC) 40 MG capsule Take 1 capsule (40 mg total) by mouth 2 (two) times daily at 8 am and 10 pm. For 3 months 60 capsule 0  . ondansetron (ZOFRAN ODT) 4 MG disintegrating tablet Take 1 tablet (4 mg total) by mouth every 8 (eight) hours as needed for nausea. 20 tablet 0  . sucralfate (CARAFATE) 1 g tablet Take 1 tablet (1 g total) by mouth 4 (four) times daily -  with meals and at bedtime. 20 tablet 0   No current facility-administered medications for this visit.    REVIEW OF SYSTEMS:   A 10+ POINT REVIEW OF SYSTEMS WAS OBTAINED including neurology, dermatology, psychiatry, cardiac, respiratory, lymph, extremities, GI, GU, Musculoskeletal, constitutional, breasts, reproductive, HEENT.  All pertinent positives are noted in the HPI.  All others are negative.   PHYSICAL EXAMINATION: ECOG PERFORMANCE STATUS: 1 - Symptomatic but completely ambulatory  . There were no vitals filed for this visit. There were no vitals filed for this visit. .There is no height or weight on file to calculate BMI.  Telehealth visit   LABORATORY DATA:  I have reviewed the data as listed  . CBC Latest Ref Rng & Units 05/19/2020 05/19/2020 05/17/2020  WBC 4.0 - 10.5 K/uL 2.1(L) - 3.4(L)  Hemoglobin 12.0 - 15.0 g/dL 40.9 - 81.1  Hematocrit 34.0 - 46.6 % 37.4 34.7(L) 36.2  Platelets 150 - 400 K/uL 167 - 176    . CMP Latest Ref Rng & Units 05/19/2020 05/17/2020 05/12/2020  Glucose 70 - 99 mg/dL 95 914(N) 81  BUN 6 - 20 mg/dL <8(G) <9(F) <6(O)  Creatinine 0.44 - 1.00 mg/dL 1.30 8.65 7.84  Sodium 135 - 145 mmol/L 139 133(L) 139  Potassium 3.5 - 5.1 mmol/L 3.3(L) 3.5 3.4(L)  Chloride 98 - 111 mmol/L 102 98 104  CO2 22 - 32 mmol/L Calcium 8.9 - 10.3 mg/dL 10.7(H) 9.5 9.8  Total  Protein 6.5 - 8.1 g/dL 7.9 7.7 7.6  Total Bilirubin 0.3 - 1.2 mg/dL 6.9(GE) 4.4(H) 2.4(H)  Alkaline Phos 38 - 126 U/L 39 35(L) 35(L)  AST 15 - 41 U/L  18 18 16   ALT 0 - 44 U/L 10 14 11      RADIOGRAPHIC STUDIES: I have personally reviewed the radiological images as listed and agreed with the findings in the report. DG Chest 2 View  Result Date: 05/12/2020 CLINICAL DATA:  Chest pain and shortness of breath for 2 days. EXAM: CHEST - 2 VIEW COMPARISON:  02/16/2020 FINDINGS: The cardiomediastinal contours are normal. The lungs are clear. Pulmonary vasculature is normal. No consolidation, pleural effusion, or pneumothorax. No acute osseous abnormalities are seen. IMPRESSION: Negative radiographs of the chest. Electronically Signed   By: 05/14/2020 M.D.   On: 05/12/2020 15:09   Narda Rutherford Abdomen Limited  Result Date: 05/17/2020 CLINICAL DATA:  Right upper quadrant pain EXAM: ULTRASOUND ABDOMEN LIMITED RIGHT UPPER QUADRANT COMPARISON:  Ultrasound 02/17/2020, CT 01/19/2020 FINDINGS: Gallbladder: Multiple mobile, echogenic and shadowing gallstones, largest measuring up to 7 mm in diameter. No gallbladder wall thickening or pericholecystic fluid. Sonographic 04/18/2020 sign is reportedly negative. Common bile duct: Diameter: 3 mm, within normal limits. Liver: No focal lesion identified. Within normal limits in parenchymal echogenicity. Portal vein is patent on color Doppler imaging with normal direction of blood flow towards the liver. Other: None. IMPRESSION: Cholelithiasis without evidence of acute cholecystitis or biliary ductal dilatation. Electronically Signed   By: 03/20/2020 M.D.   On: 05/17/2020 17:25   DG Chest Portable 1 View  Result Date: 05/17/2020 CLINICAL DATA:  Chest pain, nausea EXAM: PORTABLE CHEST 1 VIEW COMPARISON:  05/12/2020 FINDINGS: The heart size and mediastinal contours are within normal limits. Both lungs are clear. The visualized skeletal structures are unremarkable. IMPRESSION: No  active disease. Electronically Signed   By: 05/19/2020 MD   On: 05/17/2020 15:57   DG UGI W DOUBLE CM (HD BA)  Result Date: 05/31/2020 CLINICAL DATA:  Reflux disease. EXAM: ESOPHOGRAM / BARIUM SWALLOW / BARIUM TABLET STUDY TECHNIQUE: Combined double contrast and single contrast examination performed using effervescent crystals, thick barium liquid, and thin barium liquid. The patient was observed with fluoroscopy swallowing a 13 mm barium sulphate tablet. FLUOROSCOPY TIME:  Fluoroscopy Time:  2 minutes 12 second Radiation Exposure Index (if provided by the fluoroscopic device): 139 mGy Number of Acquired Spot Images: 0 COMPARISON:  None. FINDINGS: Scout radiograph is unremarkable. The swallowing mechanism is normal. The esophagus is patent. No stricture or mass. The motility of the esophagus is unremarkable. A 13 mm barium tablet was ingested which passes through the esophagus and into the stomach without difficulty. The stomach, duodenal bulb and C loop are unremarkable. No hiatal hernia identified. No significant reflux identified during the exam. IMPRESSION: 1. No hiatal hernia or reflux identified.  Normal exam. Electronically Signed   By: 05/19/2020 M.D.   On: 05/31/2020 11:04    ASSESSMENT & PLAN:   34 yo with   1) reported h/o sickle cell trait -- not confirm with labs 2) Abdominal pain -- mx by GI thought to be related to gastritis and esophagitis and ?cholelithiasis. 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  -Advised pt that depressed Haptoglobin could be caused by her gallbladder dysfunction. -Recommend pt begin OTC B-complex as long-term acid suppressants can cause nutritional deficiencies.  -Advised pt that Vitamin B12 levels appear to be  over estimated. B12 is likely leaking from  the liver as her Bilirubin was elevated. Recommend B12 levels be rechecked after her gallbladder issues are resolved.  -Will see back as needed    FOLLOW UP: RTC with Dr Candise Che as needed   The total time spent in the appt was 20 minutes and more than 50% was on counseling and direct patient cares.  All of the patient's questions were answered with apparent satisfaction. The patient knows to call the clinic with any problems, questions or concerns.    Wyvonnia Lora MD MS AAHIVMS [facility] Hematology/Oncology Physician [facility]  (Office):       [phone] (Work cell):  [phone] (Fax):           [phone]  06/09/2020 4:29 PM  I, Carollee Herter, am acting as a scribe for Dr. Wyvonnia Lora.   .I have reviewed the above documentation for accuracy and completeness, and I agree with the above. Johney Maine MD

## 2020-06-10 ENCOUNTER — Other Ambulatory Visit: Payer: Self-pay | Admitting: Hematology

## 2020-07-05 ENCOUNTER — Other Ambulatory Visit: Payer: Self-pay | Admitting: Gastroenterology

## 2020-07-06 ENCOUNTER — Other Ambulatory Visit: Payer: Self-pay | Admitting: Gastroenterology

## 2020-07-06 ENCOUNTER — Other Ambulatory Visit (HOSPITAL_COMMUNITY)
Admission: RE | Admit: 2020-07-06 | Discharge: 2020-07-06 | Disposition: A | Discharge status: Home / Self Care | Payer: BC Managed Care – PPO | Source: Ambulatory Visit | Attending: Gastroenterology | Admitting: Gastroenterology

## 2020-07-06 DIAGNOSIS — R0789 Other chest pain: Secondary | ICD-10-CM

## 2020-07-06 DIAGNOSIS — Z01812 Encounter for preprocedural laboratory examination: Secondary | ICD-10-CM | POA: Insufficient documentation

## 2020-07-06 DIAGNOSIS — Z20822 Contact with and (suspected) exposure to covid-19: Secondary | ICD-10-CM | POA: Insufficient documentation

## 2020-07-06 DIAGNOSIS — R1310 Dysphagia, unspecified: Secondary | ICD-10-CM

## 2020-07-06 LAB — SARS CORONAVIRUS 2 (TAT 6-24 HRS): SARS Coronavirus 2: NEGATIVE

## 2020-07-09 ENCOUNTER — Encounter (HOSPITAL_COMMUNITY): Payer: Self-pay | Admitting: Gastroenterology

## 2020-07-09 ENCOUNTER — Ambulatory Visit (HOSPITAL_COMMUNITY)
Admission: RE | Admit: 2020-07-09 | Discharge: 2020-07-09 | Disposition: A | Discharge status: Home / Self Care | Payer: BC Managed Care – PPO | Attending: Gastroenterology | Admitting: Gastroenterology

## 2020-07-09 ENCOUNTER — Encounter (HOSPITAL_COMMUNITY)
Admission: RE | Disposition: A | Discharge status: Home / Self Care | Payer: Self-pay | Source: Home / Self Care | Attending: Gastroenterology

## 2020-07-09 DIAGNOSIS — R1110 Vomiting, unspecified: Secondary | ICD-10-CM | POA: Insufficient documentation

## 2020-07-09 DIAGNOSIS — K222 Esophageal obstruction: Secondary | ICD-10-CM | POA: Insufficient documentation

## 2020-07-09 DIAGNOSIS — K2289 Other specified disease of esophagus: Secondary | ICD-10-CM | POA: Diagnosis not present

## 2020-07-09 DIAGNOSIS — R0989 Other specified symptoms and signs involving the circulatory and respiratory systems: Secondary | ICD-10-CM | POA: Diagnosis not present

## 2020-07-09 HISTORY — PX: ESOPHAGEAL MANOMETRY: SHX5429

## 2020-07-09 SURGERY — MANOMETRY, ESOPHAGUS

## 2020-07-09 MED ORDER — LIDOCAINE VISCOUS HCL 2 % MT SOLN
OROMUCOSAL | Status: AC
  Filled 2020-07-09: qty 15

## 2020-07-09 SURGICAL SUPPLY — 2 items
FACESHIELD LNG OPTICON STERILE (SAFETY) IMPLANT
GLOVE BIO SURGEON STRL SZ8 (GLOVE) ×6 IMPLANT

## 2020-07-09 NOTE — Progress Notes (Signed)
Esophageal manometry performed per protocol.  Patient tolerated well.  Report to be sent to Dr. Vincent Schooler. 

## 2020-07-12 ENCOUNTER — Encounter (HOSPITAL_COMMUNITY): Payer: Self-pay | Admitting: Gastroenterology

## 2020-07-20 ENCOUNTER — Other Ambulatory Visit: Payer: BC Managed Care – PPO

## 2020-07-30 ENCOUNTER — Ambulatory Visit
Admission: RE | Admit: 2020-07-30 | Discharge: 2020-07-30 | Disposition: A | Discharge status: Home / Self Care | Payer: BC Managed Care – PPO | Source: Ambulatory Visit | Attending: Gastroenterology | Admitting: Gastroenterology

## 2020-07-30 DIAGNOSIS — R1310 Dysphagia, unspecified: Secondary | ICD-10-CM

## 2020-07-30 DIAGNOSIS — R0789 Other chest pain: Secondary | ICD-10-CM

## 2020-07-30 MED ORDER — IOPAMIDOL (ISOVUE-300) INJECTION 61%
75.0000 mL | Freq: Once | INTRAVENOUS | Status: AC | PRN
  Administered 2020-07-30: 75 mL via INTRAVENOUS

## 2021-10-27 ENCOUNTER — Ambulatory Visit (HOSPITAL_COMMUNITY)
Admission: EM | Admit: 2021-10-27 | Discharge: 2021-10-27 | Disposition: A | Discharge status: Home / Self Care | Payer: BC Managed Care – PPO

## 2021-10-27 DIAGNOSIS — F332 Major depressive disorder, recurrent severe without psychotic features: Secondary | ICD-10-CM | POA: Insufficient documentation

## 2021-10-27 DIAGNOSIS — R45851 Suicidal ideations: Secondary | ICD-10-CM

## 2021-10-27 NOTE — BH Assessment (Signed)
Comprehensive Clinical Assessment (CCA) Note  10/27/2021 Heidi Keller [id number]  Per Liborio Nixon, NP, patient can be discharged home, husband agrees to monitor.  Patient can follow-up with an OP Provider  The patient demonstrates the following risk factors for suicide: Chronic risk factors for suicide include: . Acute risk factors for suicide include: psychiatric disorder of depression social withdrawal/isolation. Protective factors for this patient include: positive social support, responsibility to others (children, family), and hope for the future. Considering these factors, the overall suicide risk at this point appears to be moderate. Patient is appropriate for outpatient follow up.   GAD-7    Flowsheet Row Office Visit from 01/29/2019 in [facility]  Total GAD-7 Score 19      PHQ2-9    Flowsheet Row ED from 10/27/2021 in [facility] Office Visit from 01/29/2019 in [facility] [facility]  PHQ-2 Total Score 4 2  PHQ-9 Total Score 15 14      Flowsheet Row ED from 10/27/2021 in [facility]  C-SSRS RISK CATEGORY Moderate Risk        Chief Complaint:  Chief Complaint  Patient presents with   Suicidal   Depression   Patient presents to the [facility] with her husband.  Referred by her PCP for suicidal ideation with thoughts of cutting or stabbing herself.  Patient states that she has struggled with on-going depression, but states that her depression has escalated recently.  Patient states that she is currently experiencing hopelessness, worthlessness, guilt, isolation and low self-esteem.  Patient states that she has not been sleeping but a few hours each night.  Patient states that she has been experiencing anxiety and sadness and states that she just does not feel happy anymore. Patient states that she has been experiencing suicidal thought with thoughts of stabbing or cutting herself.  She  states that she has a history of self-damaging behaviors such as head banging in the past.  Patient denies any prior suicide attempts, she states that she has never tried to harm anyone else and states that she has no history of psychosis.  Patient states that she has never had any mental health treatment other than medication management through her PCP.   Patient reports that she lives with her husband and her 36 year old child.  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  Patient is currently not working.  She denies any history of legal involvement and states that she has no access to weapons.  Patient is alert and oriented.  Her judgment, insight and impulse control are mildly impaired.  Herthoughts are organized and her memory is intact.  She does not appear to be responding to any internal stimuli.  Her eye contact is avoided, but her speech is normal in tone and rate.   Visit Diagnosis: F33.2 MDD Recurrent Severe    CCA Screening, Triage and Referral (STR)  Patient Reported Information How did you hear about [place]? Primary Care  What Is the Reason for Your Visit/Call Today? SI with a plan.  How Long Has This Been Causing You Problems? <Week  What Do You Feel Would Help You the Most Today? Treatment for Depression or other mood problem   Have You Recently Had Any Thoughts About Hurting Yourself? Yes  Are You Planning to Commit Suicide/Harm Yourself At This time? Yes (plan to kill herself with a knife.)   Have you Recently Had Thoughts About Hurting Someone Karolee Ohs? No  Are You Planning to Harm Someone at This Time? No  Explanation: No data recorded  Have You Used Any Alcohol or Drugs in the Past 24 Hours? No  How Long Ago Did You Use Drugs or Alcohol? No data recorded What Did You Use and How Much? No data recorded  Do You Currently Have a Therapist/Psychiatrist? No data recorded Name of Therapist/Psychiatrist: No data recorded  Have You Been Recently Discharged From  Any Office Practice or Programs? No data recorded Explanation of Discharge From Practice/Program: No data recorded    CCA Screening Triage Referral Assessment Type of Contact: No data recorded Telemedicine Service Delivery:   Is this Initial or Reassessment? No data recorded Date Telepsych consult ordered in [facility]:  No data recorded Time Telepsych consult ordered in [facility]:  No data recorded Location of Assessment: No data recorded Provider Location: No data recorded  Collateral Involvement: No data recorded  Does Patient Have a [place]? No data recorded Name and Contact of Legal Guardian: No data recorded If Minor and Not Living with Parent(s), Who has Custody? No data recorded Is CPS involved or ever been involved? No data recorded Is APS involved or ever been involved? No data recorded  Patient Determined To Be At Risk for Harm To Self or Others Based on Review of Patient Reported Information or Presenting Complaint? No data recorded Method: No data recorded Availability of Means: No data recorded Intent: No data recorded Notification Required: No data recorded Additional Information for Danger to Others Potential: No data recorded Additional Comments for Danger to Others Potential: No data recorded Are There Guns or Other Weapons in Your Home? No data recorded Types of Guns/Weapons: No data recorded Are These Weapons Safely Secured?                            No data recorded Who Could Verify You Are Able To Have These Secured: No data recorded Do You Have any Outstanding Charges, Pending Court Dates, Parole/Probation? No data recorded Contacted To Inform of Risk of Harm To Self or Others: No data recorded   Does Patient Present under Involuntary Commitment? No data recorded IVC Papers Initial File Date: No data recorded  [place] of Residence: No data recorded  Patient Currently Receiving the Following Services: No data recorded  Determination of Need:  Emergent (2 hours)   Options For Referral: Inpatient Hospitalization     CCA Biopsychosocial Patient Reported Schizophrenia/Schizoaffective Diagnosis in Past: No data recorded  Strengths: none reported   Mental Health Symptoms Depression:   Change in energy/activity; Difficulty Concentrating; Hopelessness; Fatigue; Increase/decrease in appetite; Sleep (too much or little)   Duration of Depressive symptoms:  Duration of Depressive Symptoms: Greater than two weeks   Mania:   None   Anxiety:    Restlessness; Sleep; Tension; Worrying; Difficulty concentrating   Psychosis:   None   Duration of Psychotic symptoms:    Trauma:   None   Obsessions:   None   Compulsions:   None   Inattention:   None   Hyperactivity/Impulsivity:   None   Oppositional/Defiant Behaviors:   None   Emotional Irregularity:   Recurrent suicidal behaviors/gestures/threats   Other Mood/Personality Symptoms:   depressed mood, flat affect    Mental Status Exam Appearance and self-care  Stature:   Average   Weight:   Thin   Clothing:   Neat/clean   Grooming:   Normal   Cosmetic use:  Inappropriate for age   Posture/gait:   Normal   Motor activity:   Not Remarkable   Sensorium  Attention:   Normal   Concentration:   Normal   Orientation:   Object; Person; Place; Situation; Time   Recall/memory:   Normal   Affect and Mood  Affect:   Depressed; Flat   Mood:   Depressed   Relating  Eye contact:   Avoided   Facial expression:   Depressed   Attitude toward examiner:   Cooperative   Thought and Language  Speech flow:  Clear and Coherent   Thought content:   Appropriate to Mood and Circumstances   Preoccupation:  No data recorded  Hallucinations:   None   Organization:  No data recorded  [employer] of Knowledge:   Good   Intelligence:   Average   Abstraction:   Normal   Judgement:   Fair   Art therapist:    Realistic   Insight:   Lacking   Decision Making:   Normal   Social Functioning  Social Maturity:   Isolates   Social Judgement:   Normal   Stress  Stressors:   School   Coping Ability:   Overwhelmed; Exhausted   Skill Deficits:   Interpersonal   Supports:   Family     Religion: Religion/Spirituality Are You A Religious Person?:  (not assessed)  Leisure/Recreation: Leisure / Recreation Do You Have Hobbies?: No  Exercise/Diet: Exercise/Diet Do You Exercise?: No Have You Gained or Lost A Significant Amount of Weight in the Past Six Months?: No Do You Follow a Special Diet?: No Do You Have Any Trouble Sleeping?: Yes Explanation of Sleeping Difficulties: sleeps a few hours per night   CCA Employment/Education Employment/Work Situation: Employment / Work Situation Employment Situation: Radio broadcast assistant Job has Been Impacted by Current Illness: No Has Patient ever Been in the [employer]?: No  Education: Education Is Patient Currently Attending School?: Yes School Currently Attending: [employer] Last Grade Completed:  (patient is in Regulatory affairs officer) Did Physicist, medical?: Yes What Type of College Degree Do you Have?: Water quality scientist and working on a Masters Did You Have An Individualized Education Program (IIEP): No Did You Have Any Difficulty At [employer]?: No Patient's Education Has Been Impacted by Current Illness: No   CCA Family/Childhood History Family and Relationship History: Family history Marital status: Married Number of Years Married:  (not assessed) What types of issues is patient dealing with in the relationship?: no relationship issues identified Does patient have children?: Yes How many children?: 1 How is patient's relationship with their children?: patient has a 38 year old daughter and she is close to her  Childhood History:  Childhood History By whom was/is the patient raised?: Both parents Did patient suffer any  verbal/emotional/physical/sexual abuse as a child?: No Did patient suffer from severe childhood neglect?: No Has patient ever been sexually abused/assaulted/raped as an adolescent or adult?: No Was the patient ever a victim of a crime or a disaster?: No Witnessed domestic violence?: No Has patient been affected by domestic violence as an adult?: No  Child/Adolescent Assessment:     CCA Substance Use Alcohol/Drug Use: Alcohol / Drug Use Pain Medications: see MAR Prescriptions: see MAR Over the Counter: see MAR History of alcohol / drug use?: No history of alcohol / drug abuse                         ASAM's:  Six  Dimensions of Multidimensional Assessment  Dimension 1:  Acute Intoxication and/or Withdrawal Potential:      Dimension 2:  Biomedical Conditions and Complications:      Dimension 3:  Emotional, Behavioral, or Cognitive Conditions and Complications:     Dimension 4:  Readiness to Change:     Dimension 5:  Relapse, Continued use, or Continued Problem Potential:     Dimension 6:  Recovery/Living Environment:     ASAM Severity Score:    ASAM Recommended Level of Treatment:     Substance use Disorder (SUD)    Recommendations for Services/Supports/Treatments:    Discharge Disposition:    DSM5 Diagnoses: Patient Active Problem List   Diagnosis Date Noted   Severe episode of recurrent major depressive disorder, without psychotic features (HCC)    GAD (generalized anxiety disorder) 02/03/2019   Depression, major, single episode, severe ([place]) 02/03/2019   Gastroesophageal reflux disease without esophagitis 02/03/2019     Referrals to Alternative Service(s): Referred to Alternative Service(s):   Place:   Date:   Time:    Referred to Alternative Service(s):   Place:   Date:   Time:    Referred to Alternative Service(s):   Place:   Date:   Time:    Referred to Alternative Service(s):   Place:   Date:   Time:     Ardyce Heyer J Karigan Cloninger, LCAS

## 2021-10-27 NOTE — Discharge Summary (Signed)
Heidi Keller to be D/C'd Home per NP order. Discussed with the patient and all questions fully answered. An After Visit Summary was printed and given to the patient. Patient escorted out and D/C home via private auto.  Heidi Keller  10/27/2021 1:57 PM

## 2021-10-27 NOTE — Discharge Instructions (Addendum)
Discharge recommendations:  Patient is to take medications as prescribed. Please see information for follow-up appointment with psychiatry and therapy. Please follow up with your primary care provider for all medical related needs.   Therapy: We recommend that patient participate in individual therapy to address mental health concerns.  Medications: The parent/guardian is to contact a medical professional and/or outpatient provider to address any new side effects that develop. Parent/guardian should update outpatient providers of any new medications and/or medication changes.   Safety:  Remove weapons (e.g., guns, rifles, knives), all items previously/currently identified as safety concern.   Remove drugs/medications (over the counter, prescriptions, illicit drugs), all items previously/currently identified as a safety concern.    The patient should abstain from use of illicit substances/drugs and abuse of any medications. If symptoms worsen or do not continue to improve or if the patient becomes actively suicidal or homicidal then it is recommended that the patient return to the closest hospital emergency department, the [facility], or call 911 for further evaluation and treatment. National Suicide Prevention Lifeline 1-800-SUICIDE or 4458479005.  About 988 988 offers 24/7 access to trained crisis counselors who can help people experiencing mental health-related distress. People can call or text 988 or chat 988lifeline.org for themselves or if they are worried about a loved one who may need crisis support.   Please contact one of the following facilities to start medication management and therapy services:   [facility] [address] [place]  [place], [place] [address] [phone]   [facility]  [address] [place], [place] [address] [phone]  [facility]  [place]  [address] Vella Raring [place], [place] [address] [phone]  [facility]  [address]  [place], [place] [address] [phone]  [facility]  [address] [place], [place] [address] [phone]  [facility]  [address],  [place], [place] [address] [phone]

## 2021-10-27 NOTE — ED Triage Notes (Signed)
Pt Heidi Keller presents to [facility] escorted by [employer] with a complaint of worsening depression and SI with a plan  to kill herself with a knife. Pt states that she was seen my her PCP today Dr. Eartha Inch and they recommended that she come for a mental health evaluation. Pt states that she has been feeling this way for the past 3 days. Pt states that she has been diagnosed with generalized anxiety disorder but has not been compliant with medication.  Pt is emergent.

## 2021-10-27 NOTE — ED Provider Notes (Signed)
Behavioral Health Urgent Care Medical Screening Exam  Patient Name: Heidi Keller MRN: [id number] Date of Evaluation: 10/27/21 Diagnosis:  Final diagnoses:  Suicidal ideation    History of Present illness: Heidi Keller is a 36 y.o. female patient who presents to the [facility] behavioral health urgent care accompanied by [employer], along with her husband Heidi Keller with a chief complaint of suicidal ideations.   Patient seen and evaluated face-to-face face to face by this provider with her husband present, chart reviewed and case discussed with Dr. Bronwen Betters.  On evaluation, patient is alert and oriented x 4. Her thought process is logical and relevant. Her speech is clear and coherent at a decreased tone. Her mood is depressed and affect is non-congruent as the patient is noted to be smiling throughout the assessment.   Patient endorses worsening passive suicidal ideations for the past 4 days. She reports having thoughts to take a knife and stab herself in the chest. She denies intent to commit suicide.She denies a past hx of attempting suicide but reports a hx of suicidal ideations four years ago.   Patient endorses worsening depression for the past year. She describes her depressive symptoms as feelings of sadness, hopelessness, worthlessness, isolating, low self esteem, poor appetite and poor sleep.   She identifies her current stressors as having GI problems and not being able to eat. She reports a decreased appetite due to GI problems. She states that she was at her GI appointment today when she reported having suicidal thoughts.   Patient denies homicidal ideations. Patient denies auditory and visual hallucinations. There is no objective evidence that the patient is responding to internal or external stimuli.  Patient reports that she is dx with GAD and depression. She states that she was prescribed an antidepressant for her GAD and depression but stopped taking the  medication last June because she felt like it was not helping. She does not have current outpatient psychiatry or therapy. She denies a past hx of inpatient psychiatric treatment. She denies a family hx of MH or completed suicide.  Patient resides with her husband and 10 y.o daughter. She is a Gaffer at [employer]. She denies drinking alcohol or using illicit drugs.   I discussed treatment options with the patient and her husband that includes the patient staying here at the [facility] based crisis for mood stabilization, therapy and medication management. Patient declines and states that she is looking to get started on medications for depression. Her husband states that the patient needs outpatient treatment so she can start taking medications for depression. Patient denies active suicidal ideations at this time. She verbally contracts for safety and denies intent to commit suicide. She states that her 50 year old daughter prevents her from attempting suicide and that her husband of 6 years is supportive. Her husband states that she will be safe at home and will not hurt herself. He states that he will be able to stay with the patient to ensure her safety. He states that the patient does not have access to weapons, including guns and knives. Safety planning completed with both the patient and her husband.  Safety Plan Heidi Keller will reach out to husband, call 911 or call mobile crisis, or go to nearest emergency room if condition worsens or if suicidal thoughts become active Patient will follow up with the outpatient resources provided for outpatient psychiatric services (therapy/medication management).  The suicide prevention education provided includes the following: Suicide risk factors Suicide  prevention and interventions National Suicide Hotline telephone number [facility] assessment telephone number [facility] Emergency Assistance 911 [facility] and/or  Residential Mobile Crisis Unit telephone number Request made of family/significant other to:  Heidi Keller Remove weapons (e.g., guns, rifles, knives), all items previously/currently identified as safety concern.   Remove drugs/medications (over the counter, prescriptions, illicit drugs), all items previously/currently identified as a safety concern.      Psychiatric Specialty Exam  Presentation  General Appearance:Appropriate for Environment  Eye Contact:Fair  Speech:Clear and Coherent  Speech Volume:Decreased  Handedness:No data recorded  Mood and Affect  Mood:Dysphoric  Affect:Non-Congruent   Thought Process  Thought Processes:Coherent  Descriptions of Associations:Intact  Orientation:Full (Time, Place and Person)  Thought Content:Logical    Hallucinations:None  Ideas of Reference:None  Suicidal Thoughts:Yes, Passive  Homicidal Thoughts:No   Sensorium  Memory:Immediate Fair; Recent Fair; Remote Fair  Judgment:Fair  Insight:Fair   Executive Functions  Concentration:Fair  Attention Span:Fair  Recall:Fair  Fund of Knowledge:Fair  Language:No data recorded  Psychomotor Activity  Psychomotor Activity:Normal   Assets  Assets:Communication Skills; Desire for Improvement; Financial Resources/Insurance; Housing; Intimacy; Leisure Time; Resilience; Social Support; Talents/Skills; Transportation; Vocational/Educational   Sleep  Sleep:Poor   Physical Exam: Physical Exam Constitutional:      Appearance: Normal appearance.  HENT:     Head: Normocephalic and atraumatic.     Nose: Nose normal.  Eyes:     Conjunctiva/sclera: Conjunctivae normal.  Cardiovascular:     Rate and Rhythm: Normal rate.  Pulmonary:     Effort: Pulmonary effort is normal.  Musculoskeletal:     Cervical back: Normal range of motion.  Skin:    Capillary Refill: Capillary refill takes more than 3 seconds.  Neurological:     Mental Status: She is alert and oriented to  person, place, and time.   Review of Systems  Constitutional: Negative.   HENT: Negative.    Eyes: Negative.   Respiratory: Negative.    Cardiovascular: Negative.   Gastrointestinal:        Decreased appetite  Genitourinary: Negative.   Musculoskeletal: Negative.   Skin: Negative.   Neurological: Negative.   Endo/Heme/Allergies: Negative.   Blood pressure 134/72, pulse 64, temperature 97.8 F (36.6 C), temperature source Oral, resp. rate 18, SpO2 100 %. There is no height or weight on file to calculate BMI.  Musculoskeletal: Strength & Muscle Tone: within normal limits Gait & Station: normal Patient leans: N/A   [facility] MSE Discharge Disposition for Follow up and Recommendations: Based on my evaluation the patient does not appear to have an emergency medical condition and can be discharged with resources and follow up care in outpatient services for Medication Management, Individual Therapy, and Group Therapy  Discharge recommendations:  Patient is to take medications as prescribed. Please see information for follow-up appointment with psychiatry and therapy. Please follow up with your primary care provider for all medical related needs.   Therapy: We recommend that patient participate in individual therapy to address mental health concerns.  Medications: The parent/guardian is to contact a medical professional and/or outpatient provider to address any new side effects that develop. Parent/guardian should update outpatient providers of any new medications and/or medication changes.   Safety:  Remove weapons (e.g., guns, rifles, knives), all items previously/currently identified as safety concern.   Remove drugs/medications (over the counter, prescriptions, illicit drugs), all items previously/currently identified as a safety concern.    The patient should abstain from use of illicit substances/drugs and abuse of any medications.  If symptoms worsen or do not continue to improve  or if the patient becomes actively suicidal or homicidal then it is recommended that the patient return to the closest hospital emergency department, the [facility], or call 911 for further evaluation and treatment. National Suicide Prevention Lifeline 1-800-SUICIDE or 838-062-3682.  About 988 988 offers 24/7 access to trained crisis counselors who can help people experiencing mental health-related distress. People can call or text 988 or chat 988lifeline.org for themselves or if they are worried about a loved one who may need crisis support.   Please contact one of the following facilities to start medication management and therapy services:   [facility] [address] [place] [address]  [place], [place] [address] [phone]   [facility]  [address] [place], [place] [address] [phone]  [facility] [place]  [address]ella R[address] [place], [place] [address] [phone]  [facility]  [address]  [place], [place] [address] [phone]  [facility]  [address] [place], [place] [address] [phone]  [facility]  [address],  [place], [place] [address] [phone]   Layla Barter, NP 10/27/2021, 1:40 PM

## 2021-11-11 ENCOUNTER — Other Ambulatory Visit: Payer: Self-pay | Admitting: Gastroenterology

## 2021-11-11 ENCOUNTER — Other Ambulatory Visit (HOSPITAL_COMMUNITY): Payer: Self-pay | Admitting: Gastroenterology

## 2021-11-11 DIAGNOSIS — R1011 Right upper quadrant pain: Secondary | ICD-10-CM

## 2021-11-23 ENCOUNTER — Ambulatory Visit (HOSPITAL_COMMUNITY): Payer: BC Managed Care – PPO

## 2021-11-23 ENCOUNTER — Encounter (HOSPITAL_COMMUNITY): Payer: Self-pay

## 2022-04-27 IMAGING — CR DG CHEST 2V
2 series · 2 of 2 positions shown · non-contrast
Comparison: 02/16/2020

CLINICAL DATA: Chest pain and shortness of breath for 2 days.

EXAM:
CHEST - 2 VIEW

[chest pa]
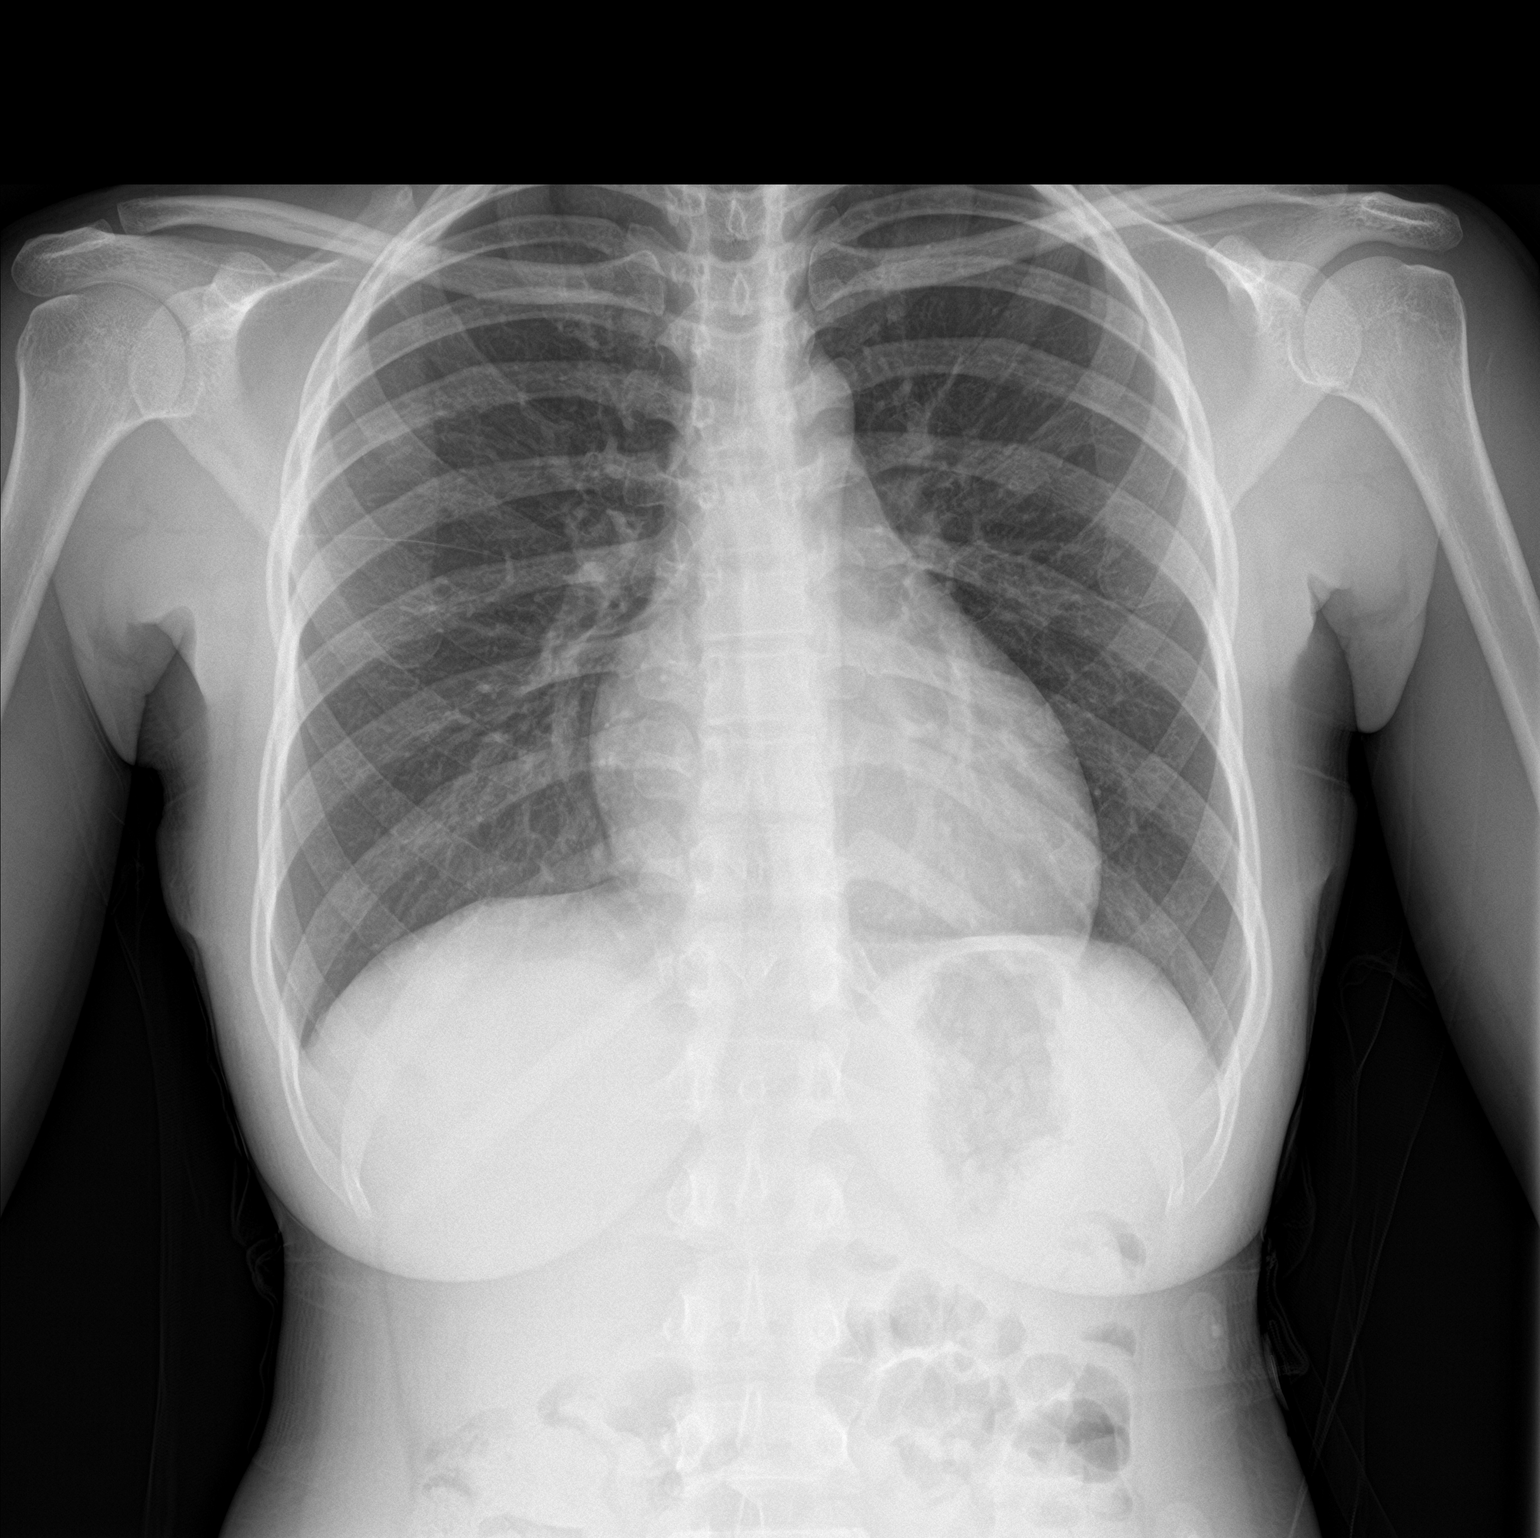

[chest lat]
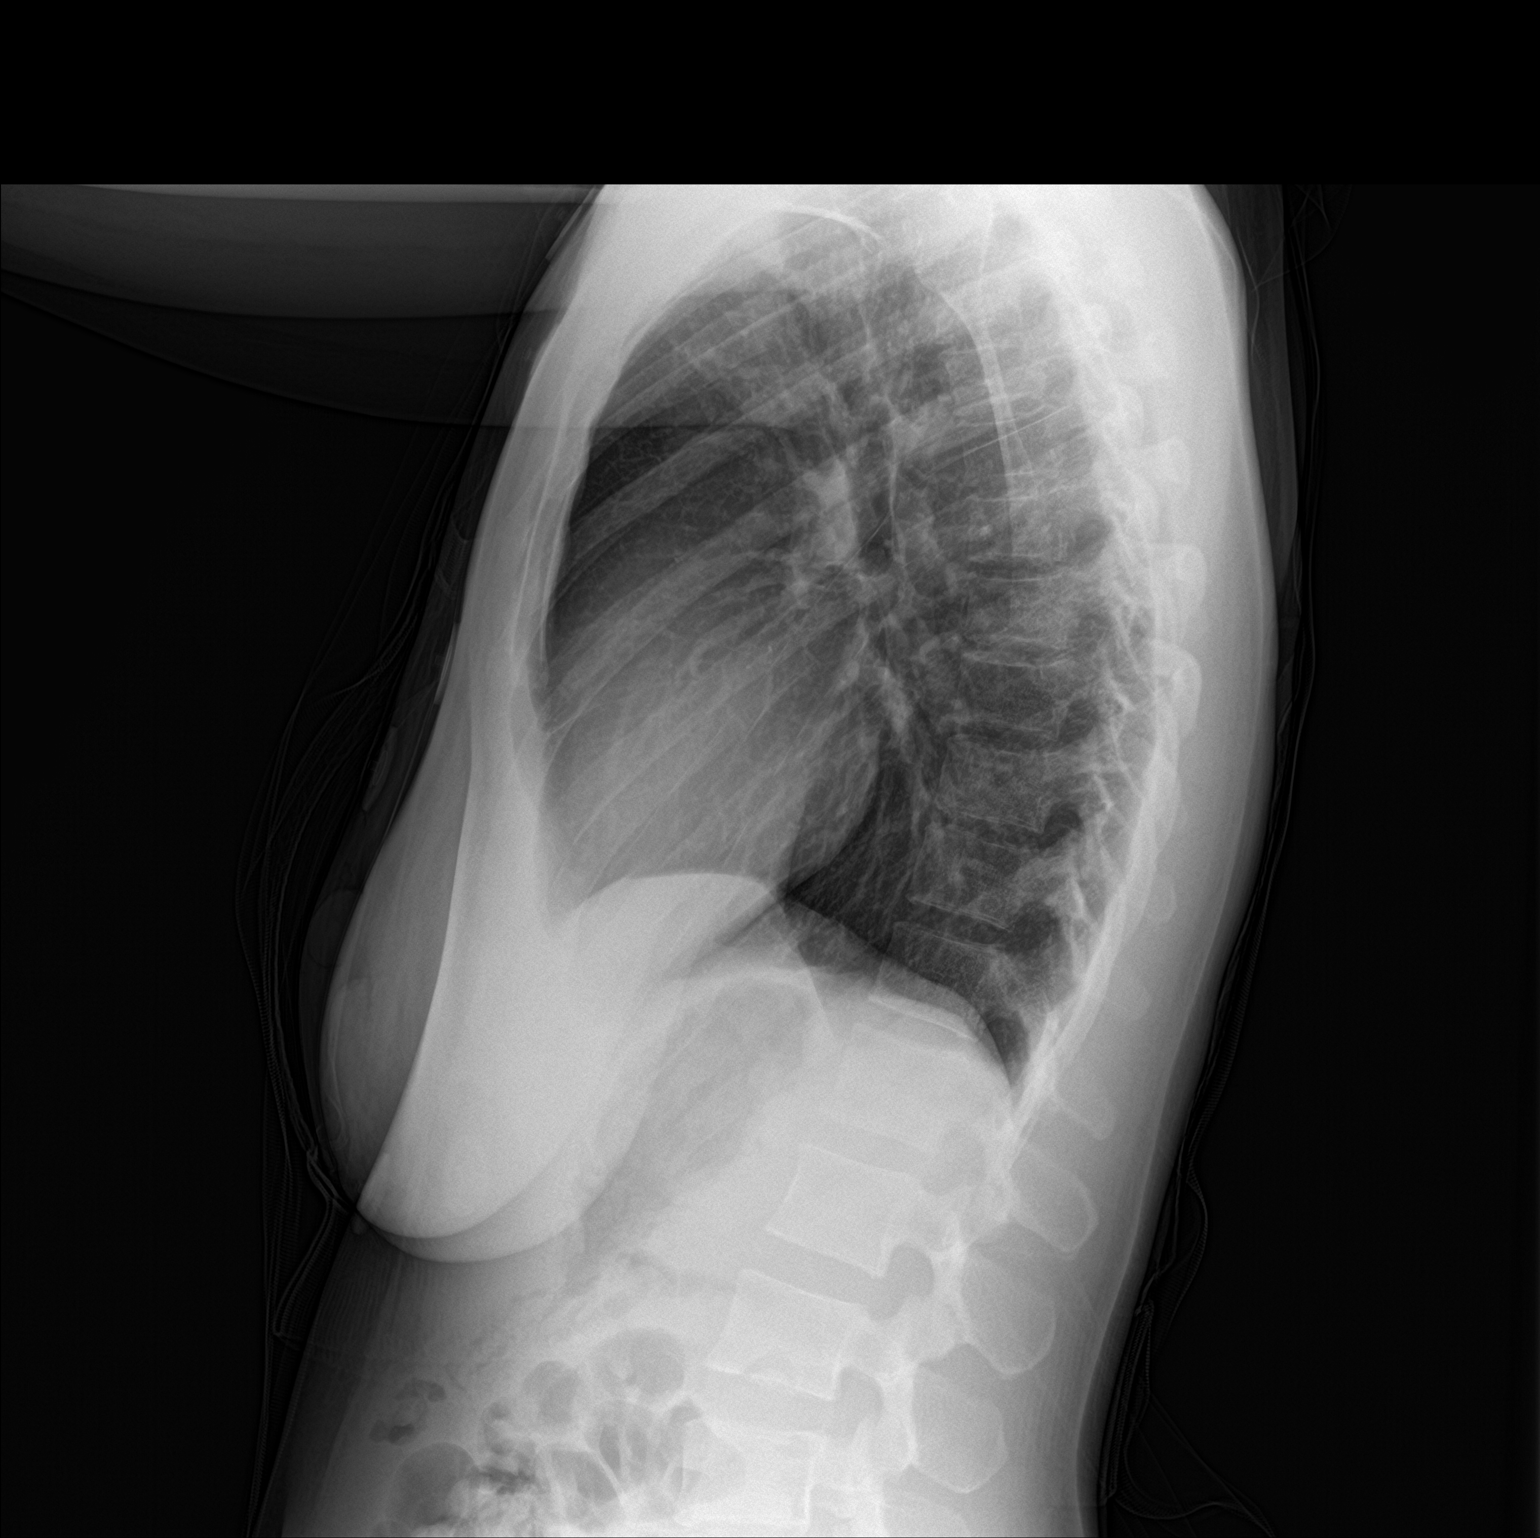

[2 of 2 positions shown; findings below may reference images not displayed]

FINDINGS: The cardiomediastinal contours are normal. The lungs are clear.
Pulmonary vasculature is normal. No consolidation, pleural effusion,
or pneumothorax. No acute osseous abnormalities are seen.
IMPRESSION: Negative radiographs of the chest.

## 2022-07-15 IMAGING — CT CT CHEST W/ CM
1 series · 15 of 34 positions shown, 19 images · IV contrast (iopamidol)
Comparison: Radiography 05/17/2020

CLINICAL DATA: Sternal chest pain, dysphagia and left-sided chest
pain over the last 4-5 months.

EXAM:
CT CHEST WITH CONTRAST
TECHNIQUE: Multidetector CT imaging of the chest was performed during
intravenous contrast administration.
CONTRAST:  75mL [id number] IOPAMIDOL ([id number]) INJECTION 61%

[Series 2: chest w/cm · axial · 0.71mm/px · z∈[-281,-13]mm · 15 of 158 slices shown, 19 images]
[im 12/158  mediastinal]
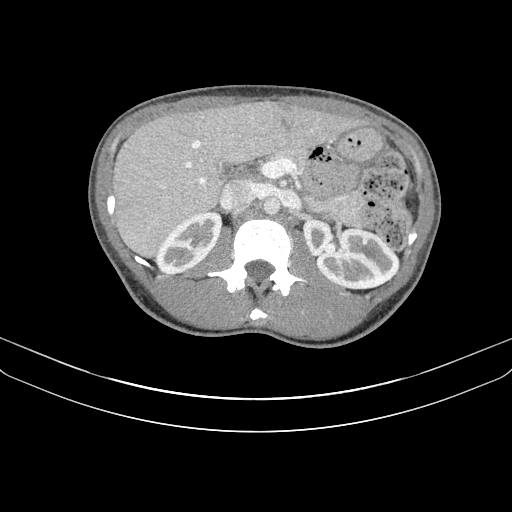
[im 12/158  lung]
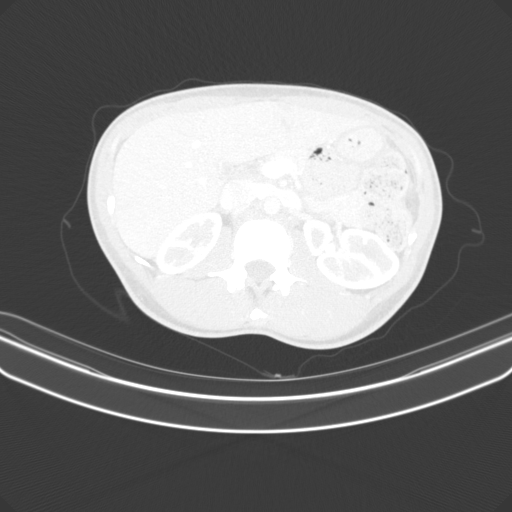
[im 24/158  lung]
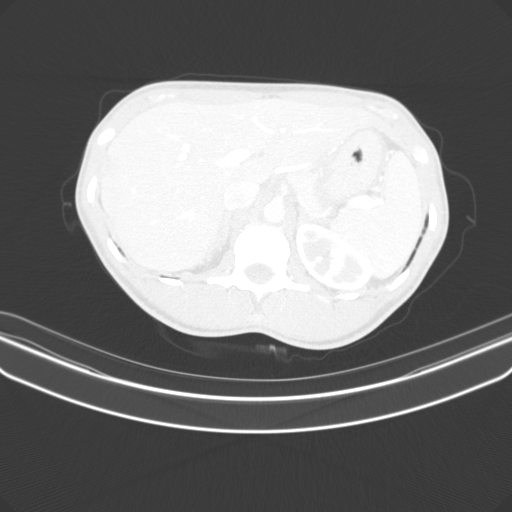
[im 32/158  lung]
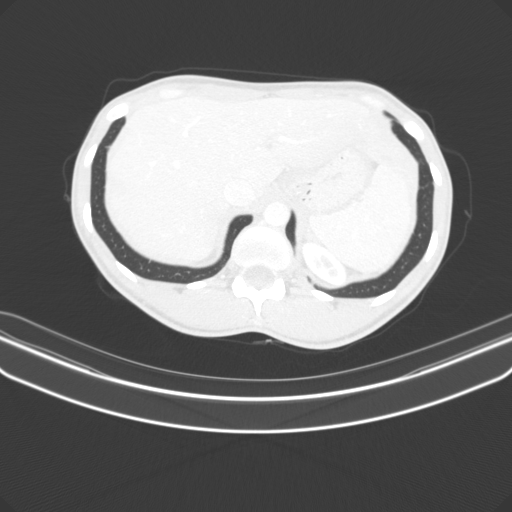
[im 41/158  lung]
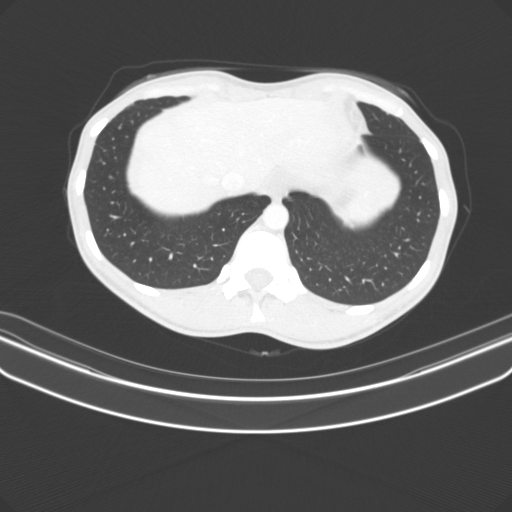
[im 53/158  mediastinal]
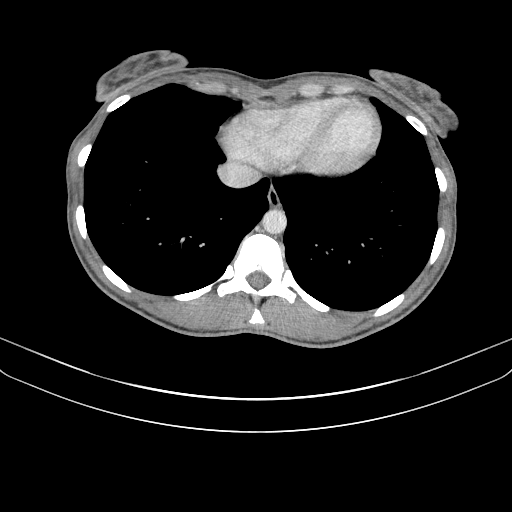
[im 53/158  lung]
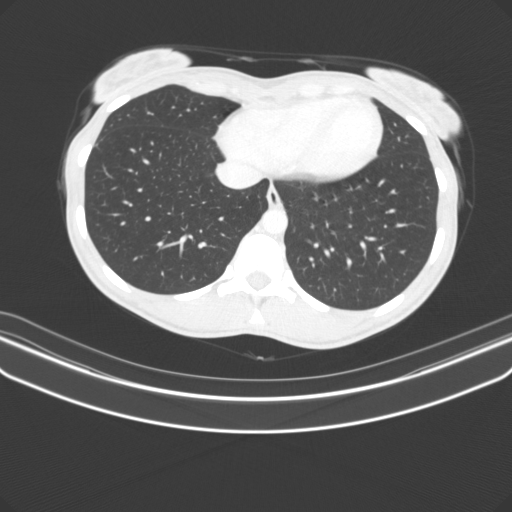
[im 63/158  lung]
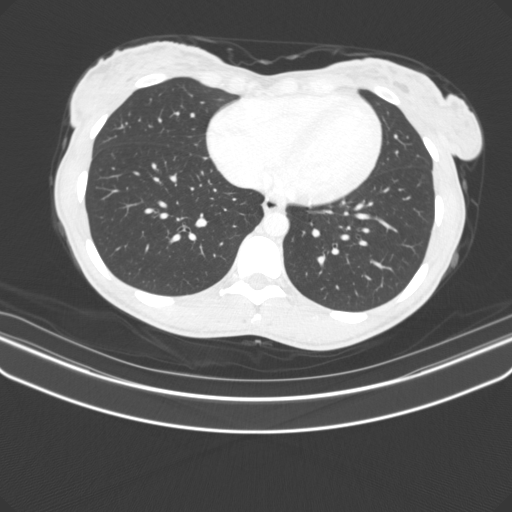
[im 70/158  lung]
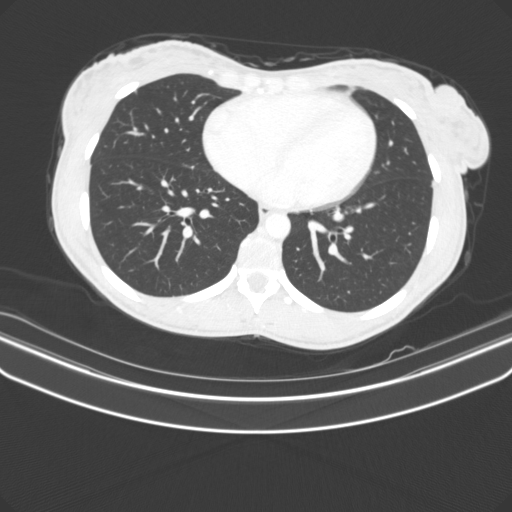
[im 82/158  lung]
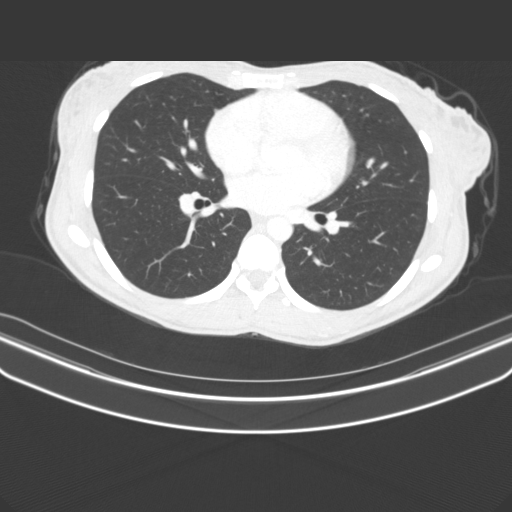
[im 88/158  mediastinal]
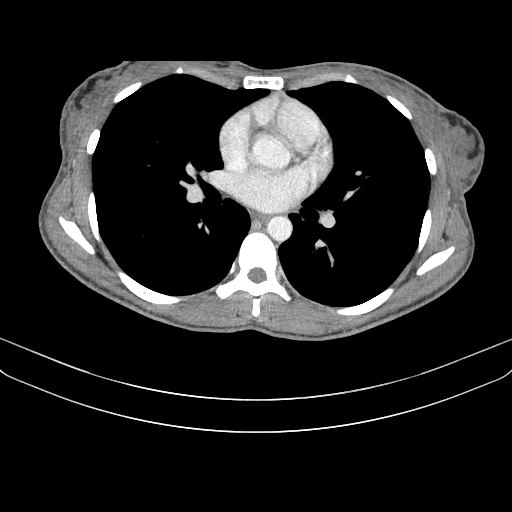
[im 88/158  lung]
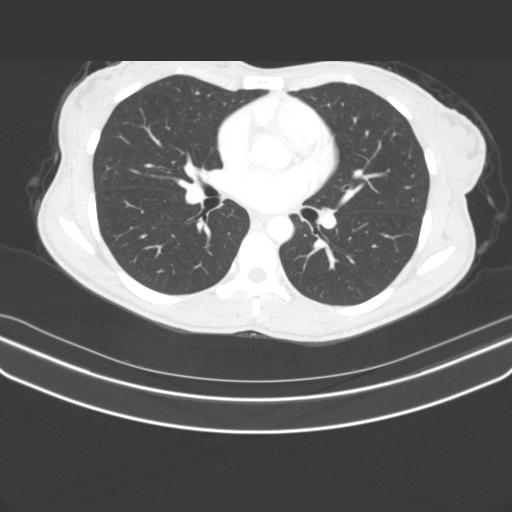
[im 95/158  lung]
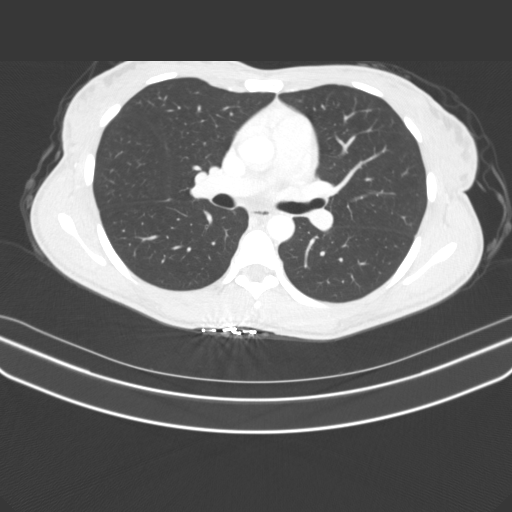
[im 105/158  lung]
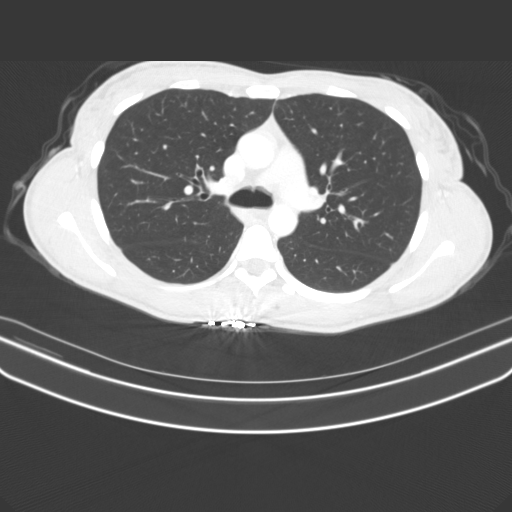
[im 117/158  lung]
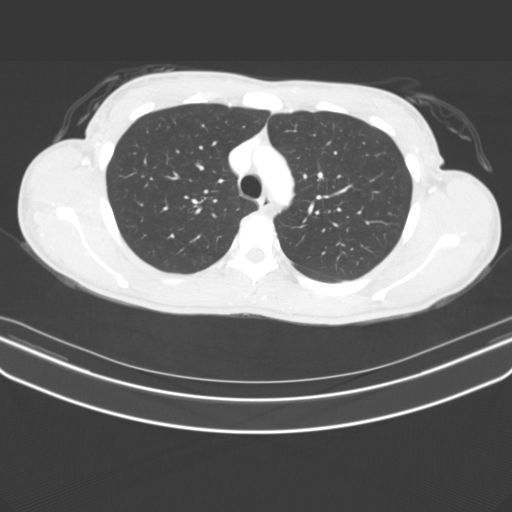
[im 126/158  mediastinal]
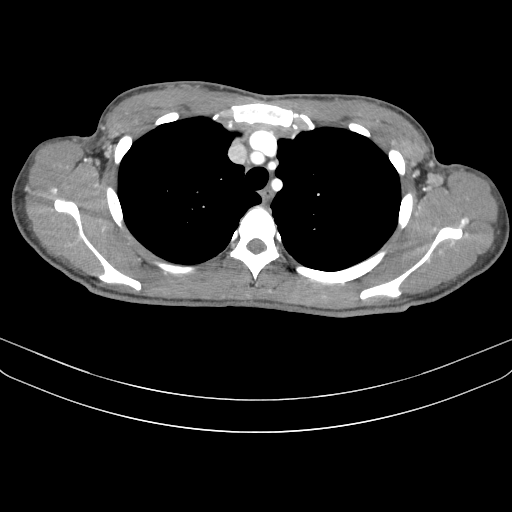
[im 126/158  lung]
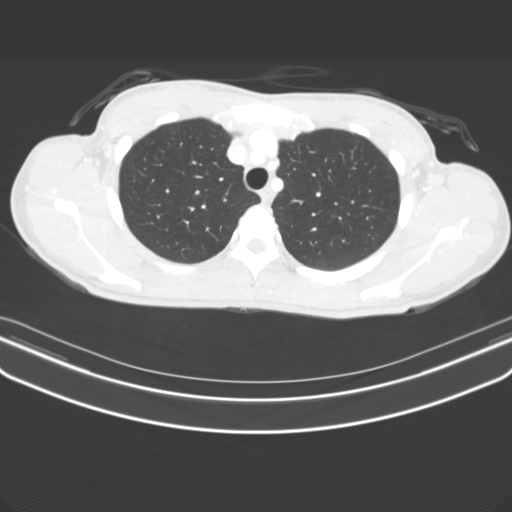
[im 134/158  lung]
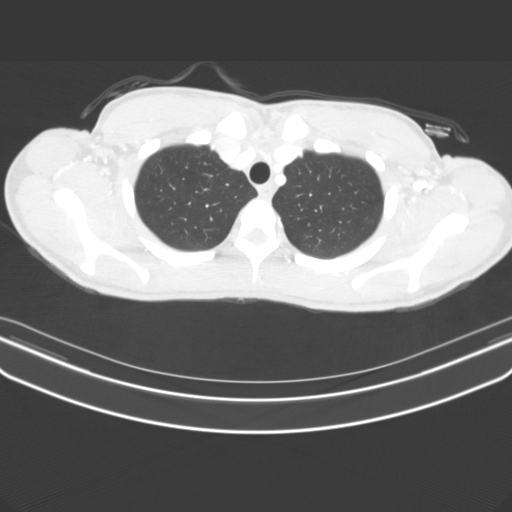
[im 146/158  lung]
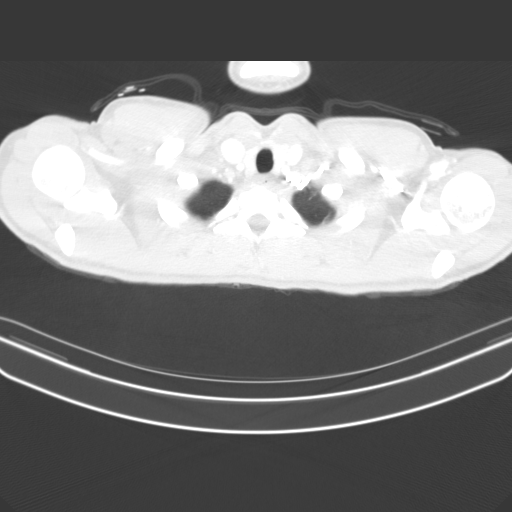

[15 of 34 positions shown; findings below may reference images not displayed]

FINDINGS: Cardiovascular: Heart size is normal. No pericardial fluid. No
aortic atherosclerotic change, aneurysm or dissection. Pulmonary
arterial branches appear normal.

Mediastinum/Nodes: No mediastinal or hilar mass or lymphadenopathy.

Lungs/Pleura: The lungs are clear. No infiltrate, collapse or
effusion. No mass. Subpleural nodule in the posterior right lower
lobe measuring 5 mm, axial image 87. 2 mm subpleural nodule left
upper lobe axial image 58. No worrisome pulmonary finding.

Upper Abdomen: Normal

Musculoskeletal: No spinal or sternal abnormality. No rib fracture
seen.
IMPRESSION: 1. No abnormality seen to explain the presenting symptoms. No
evidence of rib fracture or other chest wall pathology.
2. 5 mm subpleural nodule in the posterior right lower lobe. 2 mm
subpleural nodule in the left upper lobe. No follow-up needed if
patient is low-risk (and has no known or suspected primary
neoplasm). Non-contrast chest CT can be considered in 12 months if
patient is high-risk. This recommendation follows the consensus
statement: Guidelines for Management of Incidental Pulmonary Nodules
Detected on CT Images: From the [HOSPITAL] 8935; Radiology

## 2023-04-18 ENCOUNTER — Encounter (HOSPITAL_COMMUNITY): Payer: Self-pay | Admitting: Emergency Medicine

## 2023-04-18 ENCOUNTER — Other Ambulatory Visit: Payer: Self-pay

## 2023-04-18 ENCOUNTER — Ambulatory Visit (HOSPITAL_COMMUNITY)
Admission: EM | Admit: 2023-04-18 | Discharge: 2023-04-18 | Disposition: A | Discharge status: Home / Self Care | Payer: BC Managed Care – PPO | Attending: Emergency Medicine | Admitting: Emergency Medicine

## 2023-04-18 DIAGNOSIS — R1013 Epigastric pain: Secondary | ICD-10-CM | POA: Diagnosis not present

## 2023-04-18 DIAGNOSIS — K219 Gastro-esophageal reflux disease without esophagitis: Secondary | ICD-10-CM

## 2023-04-18 MED ORDER — SUCRALFATE 1 G PO TABS: 1.0000 g | ORAL_TABLET | Freq: Two times a day (BID) | ORAL | 0 refills | Status: DC

## 2023-04-18 MED ORDER — ESOMEPRAZOLE MAGNESIUM 40 MG PO CPDR: 40.0000 mg | DELAYED_RELEASE_CAPSULE | Freq: Every day | ORAL | 0 refills | Status: AC

## 2023-04-18 MED ORDER — LIDOCAINE VISCOUS HCL 2 % MT SOLN
15.0000 mL | Freq: Once | OROMUCOSAL | Status: AC
  Administered 2023-04-18: 15 mL via OROMUCOSAL

## 2023-04-18 MED ORDER — FAMOTIDINE 20 MG PO TABS: 20.0000 mg | ORAL_TABLET | Freq: Two times a day (BID) | ORAL | 0 refills | Status: AC | PRN

## 2023-04-18 MED ORDER — ALUM & MAG HYDROXIDE-SIMETH 200-200-20 MG/5ML PO SUSP
30.0000 mL | Freq: Once | ORAL | Status: AC
  Administered 2023-04-18: 30 mL via ORAL

## 2023-04-18 MED ORDER — ALUM & MAG HYDROXIDE-SIMETH 200-200-20 MG/5ML PO SUSP
ORAL | Status: AC
  Filled 2023-04-18: qty 30

## 2023-04-18 MED ORDER — LIDOCAINE VISCOUS HCL 2 % MT SOLN
OROMUCOSAL | Status: AC
  Filled 2023-04-18: qty 15

## 2023-04-18 NOTE — ED Notes (Signed)
Anything she eats or drinks makes this worse.  Only gets better when she takes her "PPI"

## 2023-04-18 NOTE — Discharge Instructions (Addendum)
Esomeprazole - daily first thing in the morning for 14 days. Sit upright for 30 minutes after taking  Famotidine - once or twice daily as needed for reflux.   Sucralfate - twice daily on an empty stomach you can use for 2-3 weeks if helpful   Increase fluids as much as tolerated. Bland diet is recommended. Avoid fatty, greasy, citrusy, spicy, caffeine and alcohol as these can worsen symptoms.  Do not use any ibuprofen/Advil or other NSAIDs as these can worsen symptoms.  Please follow up with your gastroenterologist and your primary care provider.  If at any point your symptoms worsen, including severe pain or inability to tolerate fluids, please go directly to the emergency department.

## 2023-04-18 NOTE — ED Triage Notes (Signed)
Patient reports center epigastric pain.  Patient presses in this area, , feels pain above in chest.  Patient reports she is out of medicine, usually takes medicine when she has a flare up of symptoms

## 2023-04-18 NOTE — ED Provider Notes (Signed)
[facility]    CSN: [id number] Arrival date & time: 04/18/23  1059     History   Chief Complaint Chief Complaint  Patient presents with   Gastroesophageal Reflux    HPI Valeska B Blatt is a 37 y.o. female.  Here with epigastric pain since Sunday 04/15/2023 Today rating 10/10. Does not radiate  Decreased appetite and not drinking fluids No nausea or vomiting. Normal BM and voiding. No fever Denies vaginal symptoms LMP 7/1  History of reflux that feels similar to this. Has seen GI in the past. Next appointment is not until October Usually does well with Nexium, pepcid, sucralfate. Requesting those today   Past Medical History:  Diagnosis Date   Anxiety    Gallstones     Patient Active Problem List   Diagnosis Date Noted   Severe episode of recurrent major depressive disorder, without psychotic features (HCC)    GAD (generalized anxiety disorder) 02/03/2019   Depression, major, single episode, severe (HCC) 02/03/2019   Gastroesophageal reflux disease without esophagitis 02/03/2019    Past Surgical History:  Procedure Laterality Date   CESAREAN SECTION  2017   ESOPHAGEAL MANOMETRY N/A 07/09/2020   Procedure: ESOPHAGEAL MANOMETRY (EM);  Surgeon: Kerin Salen, MD;  Location: [facility];  Service: Gastroenterology;  Laterality: N/A;    OB History   No obstetric history on file.      Home Medications    Prior to Admission medications   Medication Sig Start Date End Date Taking? Authorizing Provider  esomeprazole (NEXIUM) 40 MG capsule Take 1 capsule (40 mg total) by mouth daily. 04/18/23  Yes Kourtlyn Charlet, Lurena Joiner, PA-C  famotidine (PEPCID) 20 MG tablet Take 1 tablet (20 mg total) by mouth 2 (two) times daily as needed for heartburn or indigestion. 04/18/23  Yes Rosario Kushner, Lurena Joiner, PA-C  sucralfate (CARAFATE) 1 g tablet Take 1 tablet (1 g total) by mouth 2 (two) times daily for 21 days. 04/18/23 05/09/23 Yes Keola Heninger, Lurena Joiner, PA-C    Family History Family  History  Problem Relation Age of Onset   Diabetes Maternal Grandmother    Hypertension Maternal Grandmother    Sickle cell anemia Daughter 1   Colon cancer Neg Hx    Esophageal cancer Neg Hx    Rectal cancer Neg Hx    Stomach cancer Neg Hx     Social History Social History   Tobacco Use   Smoking status: Never   Smokeless tobacco: Never  Vaping Use   Vaping status: Never Used  Substance Use Topics   Alcohol use: Never   Drug use: Never     Allergies   Patient has no known allergies.   Review of Systems Review of Systems As per HPI  Physical Exam Triage Vital Signs ED Triage Vitals  Encounter Vitals Group     BP 04/18/23 1144 (!) 144/81     Systolic BP Percentile --      Diastolic BP Percentile --      Pulse Rate 04/18/23 1144 79     Resp 04/18/23 1144 20     Temp 04/18/23 1144 98 F (36.7 C)     Temp Source 04/18/23 1144 Oral     SpO2 04/18/23 1144 98 %     Weight --      Height --      Head Circumference --      Peak Flow --      Pain Score 04/18/23 1141 10     Pain Loc --  Pain Education --      Exclude from Growth Chart --    No data found.  Updated Vital Signs BP (!) 144/81 (BP Location: Right Arm)   Pulse 79   Temp 98 F (36.7 C) (Oral)   Resp 20   LMP 03/19/2023   SpO2 98%   Physical Exam Vitals and nursing note reviewed.  Constitutional:      Appearance: Normal appearance.  HENT:     Mouth/Throat:     Mouth: Mucous membranes are moist.     Pharynx: Oropharynx is clear.  Eyes:     Conjunctiva/sclera: Conjunctivae normal.  Cardiovascular:     Rate and Rhythm: Normal rate and regular rhythm.     Heart sounds: Normal heart sounds.  Pulmonary:     Effort: Pulmonary effort is normal. No respiratory distress.     Breath sounds: Normal breath sounds.  Abdominal:     General: Bowel sounds are normal. There is no distension.     Palpations: Abdomen is soft.     Tenderness: There is abdominal tenderness in the epigastric area. There  is no right CVA tenderness, left CVA tenderness, guarding or rebound.     Comments: Some tenderness epigastric. No rebound or guarding. Belly is soft  Musculoskeletal:        General: Normal range of motion.  Skin:    General: Skin is warm and dry.  Neurological:     Mental Status: She is alert and oriented to person, place, and time.     UC Treatments / Results  Labs (all labs ordered are listed, but only abnormal results are displayed) Labs Reviewed - No data to display  EKG  Radiology No results found.  Procedures Procedures   Medications Ordered in UC Medications  alum & mag hydroxide-simeth (MAALOX/MYLANTA) 200-200-20 MG/5ML suspension 30 mL (30 mLs Oral Given 04/18/23 1238)  lidocaine (XYLOCAINE) 2 % viscous mouth solution 15 mL (15 mLs Mouth/Throat Given 04/18/23 1238)    Initial Impression / Assessment and Plan / UC Course  I have reviewed the triage vital signs and the nursing notes.  Pertinent labs & imaging results that were available during my care of the patient were reviewed by me and considered in my medical decision making (see chart for details).   P.O challenge successful  GI cocktail given with improvement; pain down to 5/10. She is well appearing and in no acute distress. Vitals stable.   Esomeprazole daily, pepcid BID, sucralfate BID  Patient prefers this combination of meds Has follow up with GI but not for 3 months. Advised follow with PCP. Discussed return and ED Precautions. Patient agreeable to plan, no questions at this time  Final Clinical Impressions(s) / UC Diagnoses   Final diagnoses:  Gastroesophageal reflux disease without esophagitis  Epigastric pain     Discharge Instructions      Esomeprazole - daily first thing in the morning for 14 days. Sit upright for 30 minutes after taking  Famotidine - once or twice daily as needed for reflux.   Sucralfate - twice daily on an empty stomach you can use for 2-3 weeks if helpful    Increase fluids as much as tolerated. Bland diet is recommended. Avoid fatty, greasy, citrusy, spicy, caffeine and alcohol as these can worsen symptoms.  Do not use any ibuprofen/Advil or other NSAIDs as these can worsen symptoms.  Please follow up with your gastroenterologist and your primary care provider.  If at any point your symptoms worsen, including severe  pain or inability to tolerate fluids, please go directly to the emergency department.     ED Prescriptions     Medication Sig Dispense Auth. Provider   esomeprazole (NEXIUM) 40 MG capsule Take 1 capsule (40 mg total) by mouth daily. 14 capsule Keifer Habib, PA-C   famotidine (PEPCID) 20 MG tablet Take 1 tablet (20 mg total) by mouth 2 (two) times daily as needed for heartburn or indigestion. 50 tablet Haelee Bolen, PA-C   sucralfate (CARAFATE) 1 g tablet Take 1 tablet (1 g total) by mouth 2 (two) times daily for 21 days. 42 tablet Kristan Votta, Lurena Joiner, PA-C      PDMP not reviewed this encounter.   Marlow Baars, [place] 04/18/23 1303

## 2023-10-27 ENCOUNTER — Encounter (HOSPITAL_COMMUNITY): Payer: Self-pay

## 2023-10-27 ENCOUNTER — Ambulatory Visit (HOSPITAL_COMMUNITY)
Admission: EM | Admit: 2023-10-27 | Discharge: 2023-10-27 | Disposition: A | Discharge status: Home / Self Care | Payer: 59 | Attending: Physician Assistant | Admitting: Physician Assistant

## 2023-10-27 DIAGNOSIS — K219 Gastro-esophageal reflux disease without esophagitis: Secondary | ICD-10-CM | POA: Diagnosis not present

## 2023-10-27 HISTORY — DX: Gastro-esophageal reflux disease without esophagitis: K21.9

## 2023-10-27 HISTORY — DX: Gastritis, unspecified, without bleeding: K29.70

## 2023-10-27 MED ORDER — SUCRALFATE 1 G PO TABS: 1.0000 g | ORAL_TABLET | Freq: Two times a day (BID) | ORAL | 0 refills | Status: AC

## 2023-10-27 MED ORDER — ESOMEPRAZOLE MAGNESIUM 40 MG PO CPDR: 40.0000 mg | DELAYED_RELEASE_CAPSULE | Freq: Every day | ORAL | 0 refills | Status: AC

## 2023-10-27 MED ORDER — FAMOTIDINE 20 MG PO TABS: 20.0000 mg | ORAL_TABLET | Freq: Every day | ORAL | 0 refills | Status: AC

## 2023-10-27 NOTE — ED Provider Notes (Signed)
 [facility]    CSN: [id number] Arrival date & time: 10/27/23  1539      History   Chief Complaint Chief Complaint  Patient presents with   Gastroesophageal Reflux    HPI Ayeisha B Staiger is a 38 y.o. female.   HPI  She states she is having GERD pain  She reports she has run out of nexium  and pepcid - she states she does not always take it daily She reports she only uses when she is having a flare.  She reports burning sensation in chest related to GERD flare She denies nausea, vomiting, diarrhea, blood in vomit or stool      Past Medical History:  Diagnosis Date   Anxiety    Gallstones    Gastritis    GERD (gastroesophageal reflux disease)     Patient Active Problem List   Diagnosis Date Noted   Severe episode of recurrent major depressive disorder, without psychotic features (HCC)    GAD (generalized anxiety disorder) 02/03/2019   Depression, major, single episode, severe (HCC) 02/03/2019   Gastroesophageal reflux disease without esophagitis 02/03/2019    Past Surgical History:  Procedure Laterality Date   CESAREAN SECTION  2017   ESOPHAGEAL MANOMETRY N/A 07/09/2020   Procedure: ESOPHAGEAL MANOMETRY (EM);  Surgeon: Saintclair Jasper, MD;  Location: [facility];  Service: Gastroenterology;  Laterality: N/A;    OB History   No obstetric history on file.      Home Medications    Prior to Admission medications   Medication Sig Start Date End Date Taking? Authorizing Provider  esomeprazole  (NEXIUM ) 40 MG capsule Take 1 capsule (40 mg total) by mouth daily. 10/27/23  Yes Ambre Kobayashi E, PA-C  famotidine  (PEPCID ) 20 MG tablet Take 1 tablet (20 mg total) by mouth daily. 10/27/23  Yes Loretta Doutt E, PA-C  esomeprazole  (NEXIUM ) 40 MG capsule Take 1 capsule (40 mg total) by mouth daily. 04/18/23   Rising, Asberry, PA-C  famotidine  (PEPCID ) 20 MG tablet Take 1 tablet (20 mg total) by mouth 2 (two) times daily as needed for heartburn or indigestion. 04/18/23    Rising, Asberry, PA-C  sucralfate  (CARAFATE ) 1 g tablet Take 1 tablet (1 g total) by mouth 2 (two) times daily. 10/27/23   Lucella Pommier, [place] BRAVO, PA-C    Family History Family History  Problem Relation Age of Onset   Diabetes Maternal Grandmother    Hypertension Maternal Grandmother    Sickle cell anemia Daughter 1   Colon cancer Neg Hx    Esophageal cancer Neg Hx    Rectal cancer Neg Hx    Stomach cancer Neg Hx     Social History Social History   Tobacco Use   Smoking status: Never   Smokeless tobacco: Never  Vaping Use   Vaping status: Never Used  Substance Use Topics   Alcohol use: Never   Drug use: Never     Allergies   Patient has no known allergies.   Review of Systems Review of Systems  Constitutional:  Positive for chills. Negative for fever.  Respiratory:  Negative for shortness of breath and wheezing.   Gastrointestinal:  Positive for abdominal pain. Negative for blood in stool, diarrhea, nausea and vomiting.     Physical Exam Triage Vital Signs ED Triage Vitals [10/27/23 1726]  Encounter Vitals Group     BP (!) 144/82     Systolic BP Percentile      Diastolic BP Percentile      Pulse Rate 85  Resp 14     Temp 98.1 F (36.7 C)     Temp Source Oral     SpO2 98 %     Weight      Height      Head Circumference      Peak Flow      Pain Score 6     Pain Loc      Pain Education      Exclude from Growth Chart    No data found.  Updated Vital Signs BP (!) 144/82 (BP Location: Right Arm)   Pulse 85   Temp 98.1 F (36.7 C) (Oral)   Resp 14   LMP 10/12/2023   SpO2 98%   Visual Acuity Right Eye Distance:   Left Eye Distance:   Bilateral Distance:    Right Eye Near:   Left Eye Near:    Bilateral Near:     Physical Exam Vitals reviewed.  Constitutional:      General: She is awake.     Appearance: Normal appearance. She is well-developed and well-groomed.  HENT:     Head: Normocephalic and atraumatic.  Cardiovascular:     Rate and  Rhythm: Normal rate and regular rhythm.     Pulses: Normal pulses.          Radial pulses are 2+ on the right side and 2+ on the left side.     Heart sounds: Normal heart sounds. No murmur heard.    No friction rub. No gallop.  Pulmonary:     Effort: Pulmonary effort is normal.     Breath sounds: Normal breath sounds. No decreased air movement. No decreased breath sounds, wheezing, rhonchi or rales.  Abdominal:     General: Abdomen is flat. Bowel sounds are normal.     Palpations: Abdomen is soft.  Musculoskeletal:     Cervical back: Normal range of motion.     Right lower leg: No edema.     Left lower leg: No edema.  Neurological:     General: No focal deficit present.     Mental Status: She is alert and oriented to person, place, and time. Mental status is at baseline.     GCS: GCS eye subscore is 4. GCS verbal subscore is 5. GCS motor subscore is 6.  Psychiatric:        Attention and Perception: Attention and perception normal.        Mood and Affect: Mood and affect normal.        Speech: Speech normal.        Behavior: Behavior normal. Behavior is cooperative.        Thought Content: Thought content normal.        Cognition and Memory: Cognition normal.      UC Treatments / Results  Labs (all labs ordered are listed, but only abnormal results are displayed) Labs Reviewed - No data to display  EKG   Radiology No results found.  Procedures Procedures (including critical care time)  Medications Ordered in UC Medications - No data to display  Initial Impression / Assessment and Plan / UC Course  I have reviewed the triage vital signs and the nursing notes.  Pertinent labs & imaging results that were available during my care of the patient were reviewed by me and considered in my medical decision making (see chart for details).      Final Clinical Impressions(s) / UC Diagnoses   Final diagnoses:  Gastroesophageal reflux disease, unspecified whether  esophagitis present   Acute, recurrent Patient states that she is in a GERD flare because she has run out of medications.  She states that over-the-counter medications do not adequately manage her symptoms.  She has been taking over-the-counter Nexium  for 1 day.  Attempted to educate patient that Nexium  is a preventative.  And does not provide abortive care.  Reviewed that Nexium  and Pepcid  should be used regularly for prevention not when symptoms first started to present.  For this she would need an abortive such as Tums or Pepto-Bismol.  Patient would still like refills of these medications stating that the one from the doctor's works better.  Will send in requested prescriptions with 30-day supplies of Nexium , famotidine , sucralfate . Recommend close follow-up with PCP for ongoing medication management.  If symptoms persist or do not respond to prescribed regimen she may need to see GI and get further testing.    Discharge Instructions      I sent in the requested prescriptions.  Is important to know that each of these medications are preventative in nature.  If you are trying to prevent GERD flares you should choose at least 1 of these and take consistently.  My recommendation is to take Pepcid  daily for at least a month to calm down your current flare.  You can use the Nexium  for about 2 weeks to help with the flare.  You can use the sucralfate  as needed for acute heartburn pain.  Please make sure that you are avoiding trigger foods and drinks in your diet as these can contribute to your symptoms as well.  Please make sure that you follow-up with your PCP for ongoing medication management.  If you should run out of your medications prior to seeing your PCP Pepcid  and Nexium  are available over-the-counter at the same strength as prescription medications.     ED Prescriptions     Medication Sig Dispense Auth. Provider   esomeprazole  (NEXIUM ) 40 MG capsule Take 1 capsule (40 mg total) by mouth  daily. 30 capsule Ariah Mower E, PA-C   famotidine  (PEPCID ) 20 MG tablet Take 1 tablet (20 mg total) by mouth daily. 30 tablet Sho Salguero E, PA-C   sucralfate  (CARAFATE ) 1 g tablet Take 1 tablet (1 g total) by mouth 2 (two) times daily. 30 tablet Tawsha Terrero E, PA-C      PDMP not reviewed this encounter.   Marylene Rocky BRAVO, PA-C 10/27/23 [id number]

## 2023-10-27 NOTE — ED Triage Notes (Signed)
 Patient reports that she was diagnosed with gastritis and GERD. Patient states she has been out of Pepcid  and Nexium  and has had GERD x 3 days.

## 2023-10-27 NOTE — Discharge Instructions (Addendum)
 I sent in the requested prescriptions.  Is important to know that each of these medications are preventative in nature.  If you are trying to prevent GERD flares you should choose at least 1 of these and take consistently.  My recommendation is to take Pepcid  daily for at least a month to calm down your current flare.  You can use the Nexium  for about 2 weeks to help with the flare.  You can use the sucralfate  as needed for acute heartburn pain.  Please make sure that you are avoiding trigger foods and drinks in your diet as these can contribute to your symptoms as well.  Please make sure that you follow-up with your PCP for ongoing medication management.  If you should run out of your medications prior to seeing your PCP Pepcid  and Nexium  are available over-the-counter at the same strength as prescription medications.
